# Patient Record
Sex: Male | Born: 1945 | Race: Black or African American | Hispanic: No | Marital: Single | Smoking: Current every day smoker
Health system: Southern US, Community
[De-identification: ages and names within clinical notes are randomized; demographics above are authoritative.]

## PROBLEM LIST (undated history)

## (undated) DIAGNOSIS — E785 Hyperlipidemia, unspecified: Secondary | ICD-10-CM

## (undated) DIAGNOSIS — N189 Chronic kidney disease, unspecified: Secondary | ICD-10-CM

## (undated) DIAGNOSIS — I219 Acute myocardial infarction, unspecified: Secondary | ICD-10-CM

## (undated) DIAGNOSIS — C801 Malignant (primary) neoplasm, unspecified: Secondary | ICD-10-CM

## (undated) DIAGNOSIS — Z716 Tobacco abuse counseling: Secondary | ICD-10-CM

## (undated) DIAGNOSIS — I509 Heart failure, unspecified: Secondary | ICD-10-CM

## (undated) DIAGNOSIS — K219 Gastro-esophageal reflux disease without esophagitis: Secondary | ICD-10-CM

## (undated) DIAGNOSIS — I679 Cerebrovascular disease, unspecified: Secondary | ICD-10-CM

## (undated) DIAGNOSIS — R0602 Shortness of breath: Secondary | ICD-10-CM

## (undated) DIAGNOSIS — I639 Cerebral infarction, unspecified: Secondary | ICD-10-CM

## (undated) DIAGNOSIS — E079 Disorder of thyroid, unspecified: Secondary | ICD-10-CM

## (undated) DIAGNOSIS — Z720 Tobacco use: Secondary | ICD-10-CM

## (undated) DIAGNOSIS — I10 Essential (primary) hypertension: Secondary | ICD-10-CM

## (undated) HISTORY — DX: Tobacco abuse counseling: Z71.6

## (undated) HISTORY — DX: Chronic kidney disease, unspecified: N18.9

## (undated) HISTORY — DX: Cerebral infarction, unspecified: I63.9

## (undated) HISTORY — DX: Tobacco use: Z72.0

## (undated) HISTORY — DX: Heart failure, unspecified: I50.9

## (undated) HISTORY — DX: Essential (primary) hypertension: I10

## (undated) HISTORY — DX: Disorder of thyroid, unspecified: E07.9

## (undated) HISTORY — DX: Cerebrovascular disease, unspecified: I67.9

## (undated) HISTORY — DX: Acute myocardial infarction, unspecified: I21.9

## (undated) HISTORY — DX: Malignant (primary) neoplasm, unspecified: C80.1

## (undated) HISTORY — DX: Hyperlipidemia, unspecified: E78.5

## (undated) HISTORY — PX: OTHER SURGICAL HISTORY: SHX169

## (undated) HISTORY — PX: ANKLE FRACTURE SURGERY: SHX122

---

## 2001-08-14 HISTORY — PX: BACK SURGERY: SHX140

## 2002-01-22 ENCOUNTER — Encounter: Payer: Self-pay | Admitting: Neurosurgery

## 2002-01-27 ENCOUNTER — Ambulatory Visit (HOSPITAL_COMMUNITY): Admission: RE | Admit: 2002-01-27 | Discharge: 2002-01-27 | Payer: Self-pay | Admitting: Neurosurgery

## 2002-01-27 ENCOUNTER — Encounter: Payer: Self-pay | Admitting: Neurosurgery

## 2003-01-27 ENCOUNTER — Encounter: Payer: Self-pay | Admitting: Neurosurgery

## 2003-01-27 ENCOUNTER — Encounter: Admission: RE | Admit: 2003-01-27 | Discharge: 2003-01-27 | Payer: Self-pay | Admitting: Neurosurgery

## 2003-04-08 ENCOUNTER — Encounter: Payer: Self-pay | Admitting: Neurosurgery

## 2003-04-13 ENCOUNTER — Encounter: Payer: Self-pay | Admitting: Neurosurgery

## 2003-04-13 ENCOUNTER — Ambulatory Visit (HOSPITAL_COMMUNITY): Admission: RE | Admit: 2003-04-13 | Discharge: 2003-04-14 | Payer: Self-pay | Admitting: Neurosurgery

## 2003-06-15 ENCOUNTER — Other Ambulatory Visit: Payer: Self-pay

## 2004-07-04 ENCOUNTER — Encounter: Admission: RE | Admit: 2004-07-04 | Discharge: 2004-07-04 | Payer: Self-pay | Admitting: Neurosurgery

## 2006-03-13 ENCOUNTER — Other Ambulatory Visit: Payer: Self-pay

## 2006-03-14 ENCOUNTER — Other Ambulatory Visit: Payer: Self-pay

## 2006-09-19 ENCOUNTER — Other Ambulatory Visit: Payer: Self-pay

## 2007-02-27 ENCOUNTER — Other Ambulatory Visit: Payer: Self-pay

## 2009-05-19 ENCOUNTER — Other Ambulatory Visit: Payer: Self-pay | Admitting: Emergency Medicine

## 2009-05-26 ENCOUNTER — Other Ambulatory Visit: Payer: Self-pay

## 2009-08-14 ENCOUNTER — Emergency Department (HOSPITAL_COMMUNITY): Admission: EM | Admit: 2009-08-14 | Discharge: 2009-08-14 | Payer: Self-pay | Admitting: Emergency Medicine

## 2009-09-15 ENCOUNTER — Other Ambulatory Visit: Payer: Self-pay

## 2009-11-11 ENCOUNTER — Other Ambulatory Visit: Payer: Self-pay

## 2010-12-30 NOTE — Op Note (Signed)
NAME:  Martin Haney, Martin Haney NO.:  [id number]   MEDICAL RECORD NO.:  [id number]                   PATIENT TYPE:  OIB   LOCATION:  2894                                 FACILITY:  [facility]   PHYSICIAN:  Kathaleen Maser. Haney, M.D.                 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   PROCEDURE:  Left L3-4 decompressive laminotomy with foraminotomy, left L4-5  reexploration of laminotomy with redo microdiskectomy.   SURGEON:  Kathaleen Maser. Haney, M.D.   ASSISTANT:  Reinaldo Meeker, M.D.   ANESTHESIA:  General endotracheal anesthesia.   INDICATIONS FOR PROCEDURE:  Martin Haney is a 65 year old male who is status  post  a previous left-sided L4-5 microdiskectomy. Postoperatively the  patient has had continued left lower extremity radicular pain, which  although improved, is still  quite limiting. A workup has demonstrated  worsening stenosis at the L3-4 level  and some degree of a small  recurrent  disk  herniation at L4-5. The patient has failed conservative management. He  presents now for decompression of L3-4 and redo microdiskectomy at L4-5.   DESCRIPTION OF PROCEDURE:  The patient was placed on the operating table in  the supine position. After an adequate level  of anesthesia was achieved,  the patient was placed prone  onto the Wilson frame and appropriately  padded. The patient's lumbar region was prepped and draped sterilely.   The procedure was started with a skin incision overlying the L3, 4 and 5  levels. This was carried down sharply in the midline. A subperiosteal  dissection was performed exposing the lamina and facet joints of  L3-4 and L4-  5. A deep self-retaining retractor was placed. Interoperative x-rays were  taken and the levels were confirmed.   A laminotomy was then performed at L3-4 using a high-speed drill and  Kerrison rongeurs to remove the inferior one-third of the lamina of L3, the  medial one-third of the L4 facet joint and the entire left L4 lamina down to  the level of the previous laminotomy site  at L4-5. The L4-5 laminotomy was  then dissected free using Kerrison rongeurs and dental instruments. It was  widened slightly. Epidural scar was resected.   The microscope was brought into the field, using microdissection at both the  L3-4 and L4-5 levels. The L4 nerve root was identified and followed into its  foramen. It was freed from any compression. The disk space was identified  and explored. It was found to be free of any compression.   Attention was then placed at the L4-5. The L5  nerve root was identified. It  was mobilized along  the pedicle. Epidural scar was resected. The  thecal  sac and L5 nerve root were mobilized towards the midline. The disk space  itself was broadly herniated. It was incised with a #15 blade, retracting  the fascia, a widened the disk space was then achieved using the pituitary  rongeurs and Epstein curets. All elements of the disk herniation were  completely resected.  All degenerative disk material was removed from the  interspace.   The wound was then irrigated with antibiotic solution. Gelfoam was placed  for operative hemostasis which was found to be good. The microscope and  retractors were removed. Hemostasis in the muscle was achieved with  electrocautery. The wounds were closed in layers with Vicryl sutures. Steri-  Strips and sterile dressing were applied.   There were no apparent complications. The patient tolerated the procedure  well and he returns to the recovery room in stable condition.                                               Martin A.  Haney, M.D.    HAP/MEDQ  D:  04/13/2003  T:  04/13/2003  Job:  [id number]

## 2010-12-30 NOTE — Op Note (Signed)
[place]  Patient:    Martin Haney, Martin Haney Visit Number: [id number] MRN: [id number]          Service Type: DSU Location: 3000 [id number] Attending Physician:  Donn Pierini Dictated by:   Julio Sicks, M.D. Proc. Date: 01/27/02 Admit Date:  01/27/2002 Discharge Date: 01/27/2002                             Operative Report  PREOPERATIVE DIAGNOSIS:  Left L4-5 herniated nucleus pulposus with radiculopathy.  POSTOPERATIVE DIAGNOSIS:  Left L4-5 herniated nucleus pulposus with radiculopathy.  OPERATION PERFORMED:  Left L4-5 laminotomy and microdiskectomy.  SURGEON:  Julio Sicks, M.D.  ASSISTANT:  Reinaldo Meeker, M.D.  ANESTHESIA:  General endotracheal.  INDICATIONS FOR PROCEDURE:  The patient is a 65 year old male with history of left lower extremity pain, paresthesias and weakness including left-sided L5 radiculopathy, failing conservative management.  MRI scan demonstrates a large left-sided L4-5 disk herniation with compression of the left-sided L5 nerve root.  The patient is aware of the risks and benefits and wishes to proceed with a left-sided L4-5 laminotomy and microdiskectomy for hopeful improvement of symptoms.  DESCRIPTION OF PROCEDURE:  The patient was taken to the operating room and placed on the operating table in supine position.  After an adequate level of anesthesia was achieved, the patient was positioned prone onto a Wilson frame and appropriately padded.  The patients lumbar region was shaved and prepped sterilely.  A 10 blade was used to make a linear skin incision overlying the L4-5 interspace.  This was carried down sharply in the midline.  Subperiosteal dissection was performed on the left side exposing the lamina and facet joints at L4 and L5.  The deep self-retaining retractor was placed.  Intraoperative X-ray was taken and the level was confirmed.  A laminotomy was then performed using a high speed drill and  Kerrison rongeurs to remove the inferior one fourth of the lamina of L4, the medial one fourth of the L4-5 facet joint and superior rim of the L5 lamina.  The ligamentum flavum was then elevated and resected in piecemeal fashion using Kerrison rongeurs.  The underlying thecal sac and exiting L5 nerve roots were identified.  Microscope was then brought into the field and used for microdissection of the left sided L5 nerve root and underlying disk herniation.  Epidural venous plexus was coagulated and cut. The thecal sac and L5 nerve root were mobilized and retracted towards the midline.  The disk herniation was readily apparent.  This was incised with a 15 blade in rectangular fashion.  A wide disk space clean-out was then achieved using pituitary rongeurs, upward angle pituitary rongeurs and Epstein curets.  All elements of disk herniation were completely resected.  All loose or obviously degenerative disk material was removed from the interspace. After a very thorough diskectomy had been performed, the thecal sac and nerve roots were explored and found to be free from any injury or further compression.  The wound was then irrigated with antibiotic solution.  Gelfoam was placed topically for hemostasis which was found to be good.  The microscope and retractor system were removed.  Hemostasis in the muscle was achieved with electrocautery.  The wound was then closed in layers with Vicryl suture.  Steri-Strips and sterile dressing were applied.  There were no apparent complications.  The patient tolerated the procedure well and he returned to the recovery room postoperatively. Gelfoam  was placed over the laminotomy defect.  Microscope and retractor system were removed.  Hemostasis in the muscle achieved with electrocautery. The wound was then closed in layers with Vicryl sutures.  Steri-Strips and sterile dressing were applied.  There were no apparent complications.  The patient tolerated the  procedure well and she returned to the recovery room postoperatively. Dictated by:   Julio Sicks, M.D. Attending Physician:  Donn Pierini DD:  01/27/02 TD:  01/27/02 Job: [id number]

## 2011-01-05 ENCOUNTER — Other Ambulatory Visit: Payer: Self-pay

## 2011-01-30 IMAGING — CT CT ABD-PELV W/O CM
1 of 3 series · 12 of 32 positions shown, 18 images · non-contrast
Comparison: none

REASON FOR EXAM: (1) left flank pain; (2) same
COMMENTS:

[Series 2: stone · axial · 0.59mm/px · z∈[-460,-73]mm · 12 of 151 slices shown, 18 images]
[im 11/151  soft-tissue]
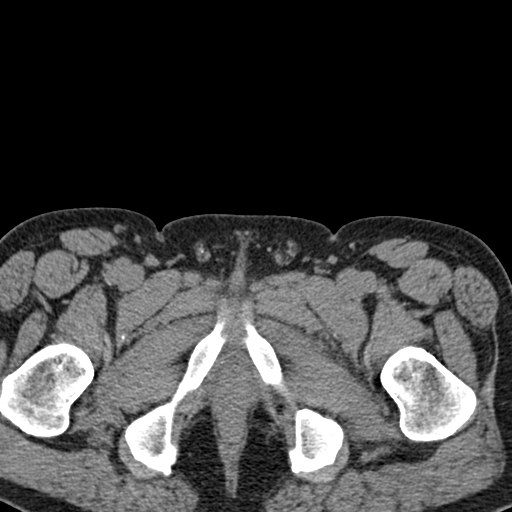
[im 11/151  bone]
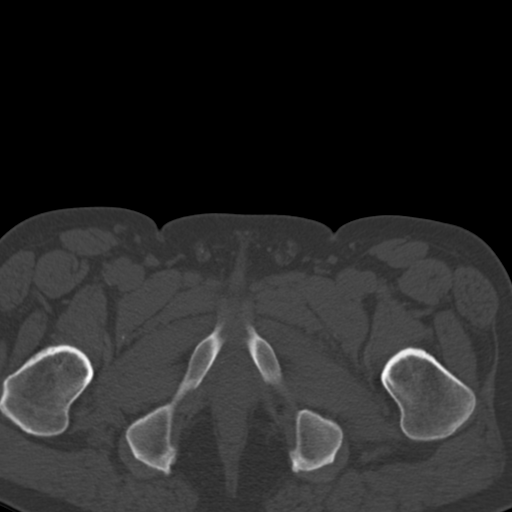
[im 22/151  soft-tissue]
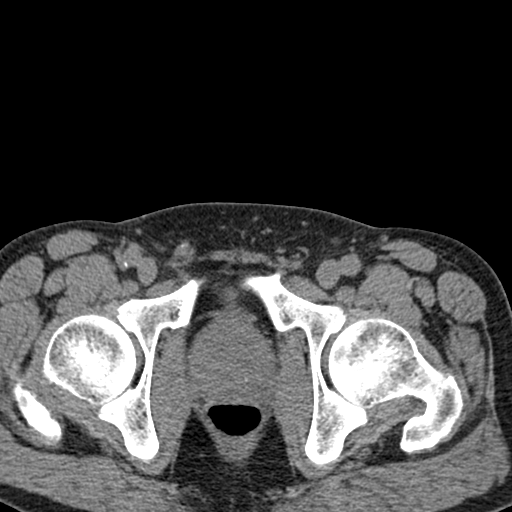
[im 33/151  soft-tissue]
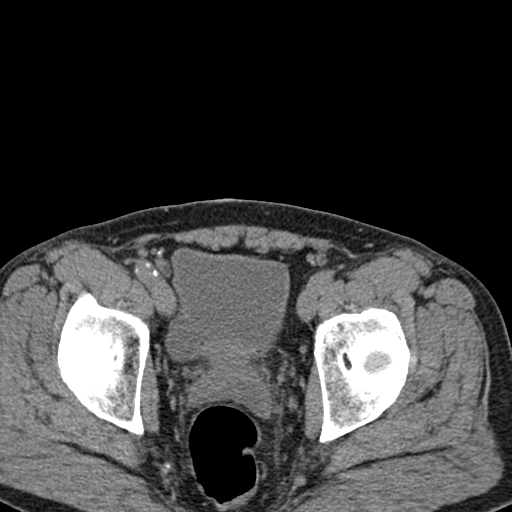
[im 43/151  soft-tissue]
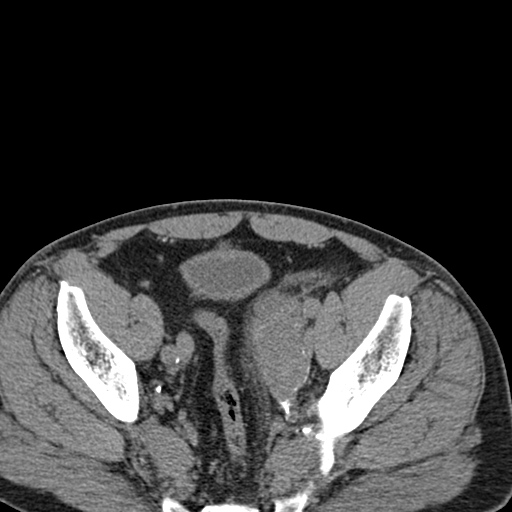
[im 54/151  soft-tissue]
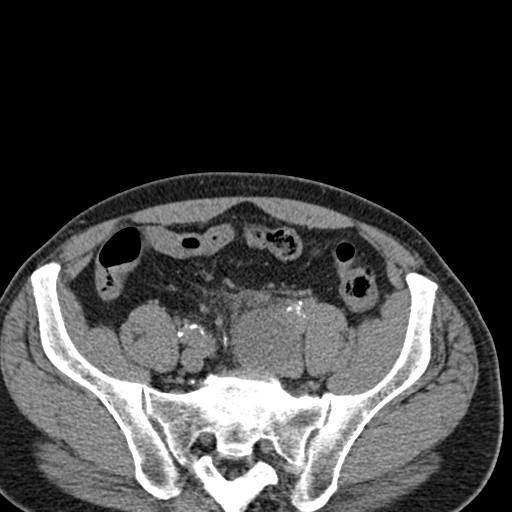
[im 65/151  soft-tissue]
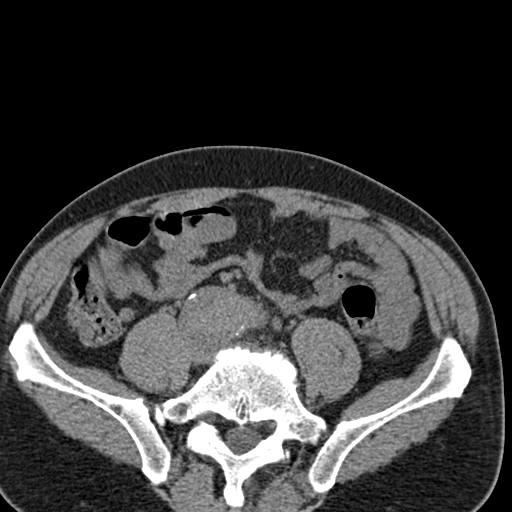
[im 86/151  soft-tissue]
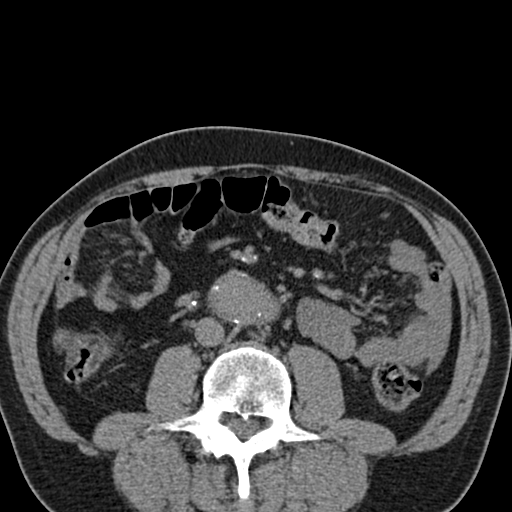
[im 97/151  soft-tissue]
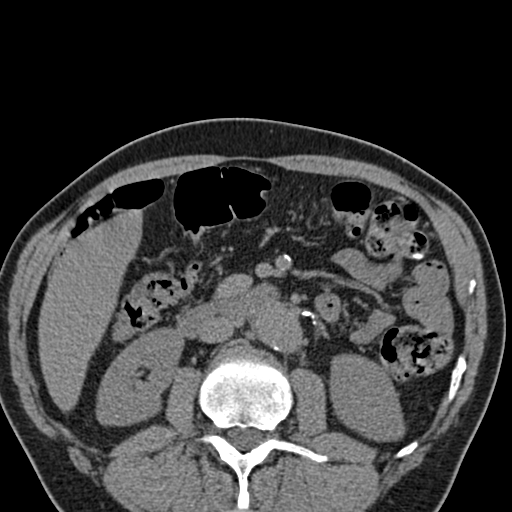
[im 108/151  soft-tissue]
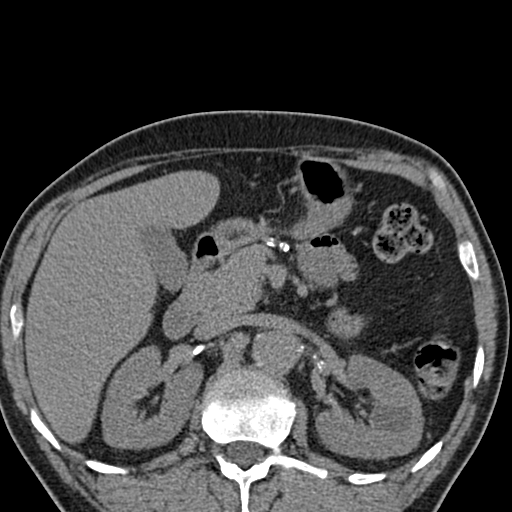
[im 108/151  lung]
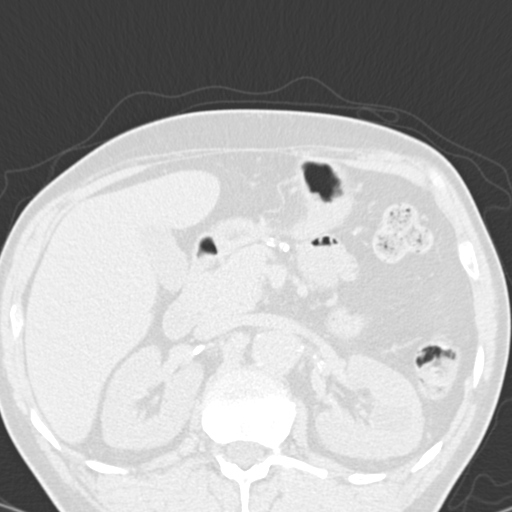
[im 108/151  bone]
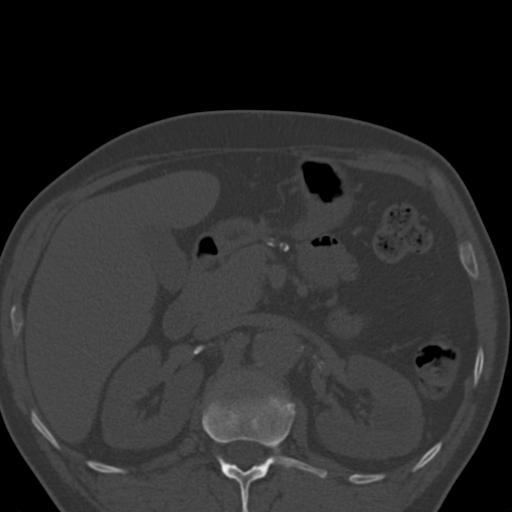
[im 118/151  soft-tissue]
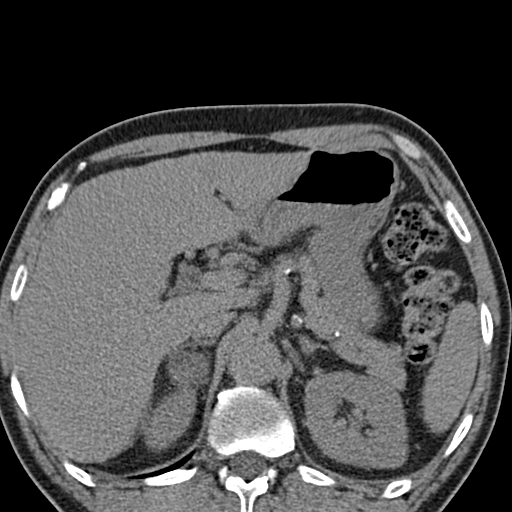
[im 118/151  lung]
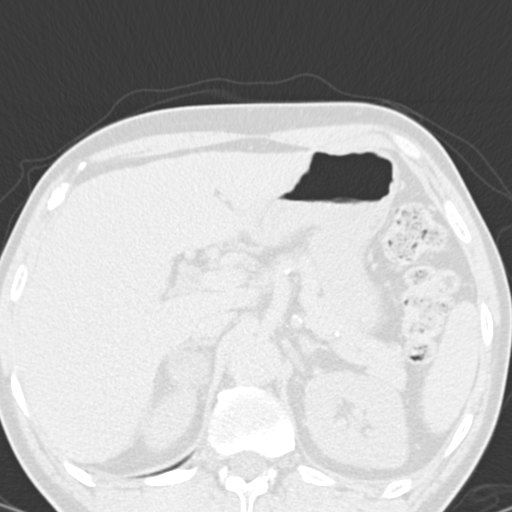
[im 129/151  soft-tissue]
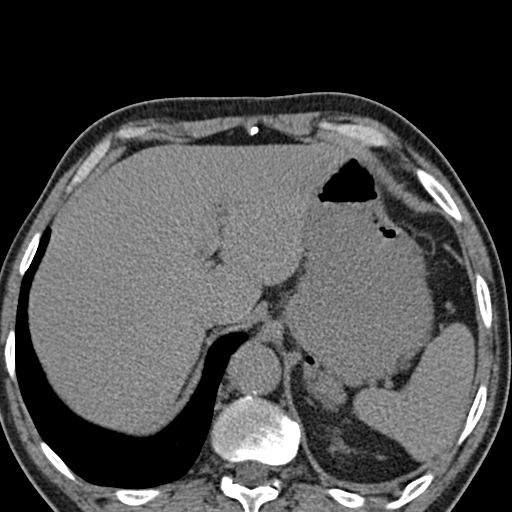
[im 129/151  lung]
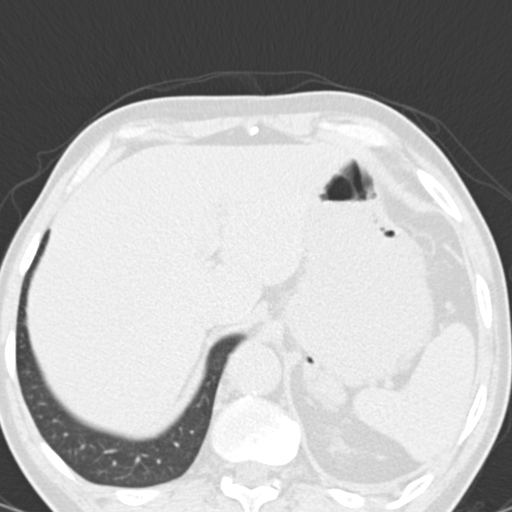
[im 140/151  soft-tissue]
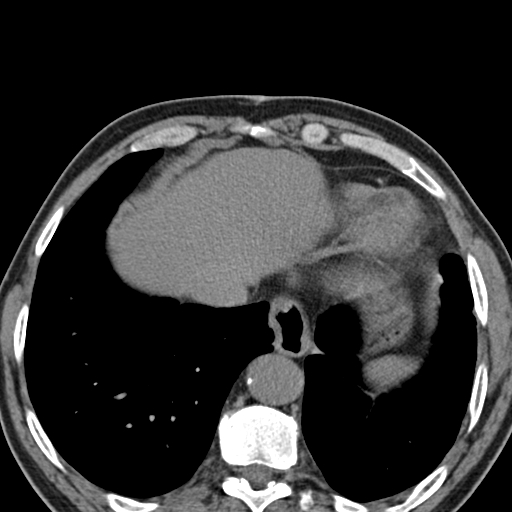
[im 140/151  lung]
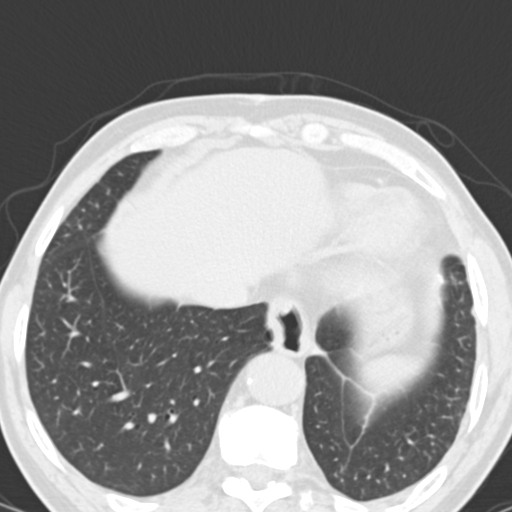

[12 of 32 positions shown; findings below may reference images not displayed]

PROCEDURE:     CT  - CT ABDOMEN AND PELVIS W[DATE]  [DATE]

RESULT:     Helical noncontrasted 3 mm sections were obtained from the lung
bases through the pubic symphysis. This study was compared to a previous CT
of the chest and abdomen dated 01/14/2008.

Evaluation of the lung bases demonstrates no gross abnormalities.

Noncontrast evaluation of the liver, spleen and RIGHT kidney is
unremarkable. The LEFT kidney demonstrates a low attenuating mass in the
cortical portion of the kidney like representing a cyst unchanged when
compared to the previous study. A RIGHT adrenal mass is appreciated
measuring 2.82 x 3.61 cm in AP x transverse dimensions with technical
variability taken into consideration stable when compared to the previous
study. A LEFT adrenal mass is also appreciated which appears stable when
compared to the previous study. Evaluation of the upper abdomen demonstrates
no evidence of free fluid or drainable loculated fluid collections or
adenopathy. There is aneurysmal dilatation of the infrarenal abdominal aorta
measuring 3.44 x 3.63 cm in AP by transverse dimensions. There is aneurysmal
dilatation of the RIGHT and LEFT iliacs with the RIGHT measuring 2.72 cm in
AP dimensions and the LEFT 3.34 in maximal transverse dimensions. This is
just below the level of the bifurcation. Within the region of the proximal
LEFT common iliac at the level of the bifurcation, a soft tissue mass is
appreciated measuring 5.46 x 5.66 cm. There are areas of peripheral
calcifications within this mass and this area either compresses, invades or
involves the LEFT common iliac. This mass is suspicious for possible
pseudoaneurysm of the iliac. Further evaluation with contrasted CT is
recommended and possibly vascular interventional surgical consultation.
Areas of stranding are appreciated in the surrounding mesenteric fat which
may represent inflammatory change. Areas of mild hemorrhage or previous
hemorrhage cannot be excluded. No drainable loculated fluid collections are
appreciated. The prostate is enlarged measuring 6.36 cm in diameter.
IMPRESSION: 1. Findings concerning for pseudoaneurysm involving the LEFT common iliac.
Alternatively, a nonaneurysmal mass with compression and/or invasion is a
diagnostic consideration. Further evaluation with contrast CT is recommended
and possibly surgical endovascular consultation.
2. Infrarenal abdominal aortic aneurysm as described above as well as
aneurysmal dilatation of the iliacs.
3. Stable bilateral adrenal masses.
4. Dr. Luisa of the Emergency Department was informed of these findings at
the time of the initial interpretation.

## 2011-04-15 DIAGNOSIS — I679 Cerebrovascular disease, unspecified: Secondary | ICD-10-CM

## 2011-04-15 HISTORY — DX: Cerebrovascular disease, unspecified: I67.9

## 2011-05-15 ENCOUNTER — Other Ambulatory Visit: Payer: Self-pay | Admitting: Emergency Medicine

## 2011-05-16 DIAGNOSIS — I679 Cerebrovascular disease, unspecified: Secondary | ICD-10-CM

## 2011-05-16 DIAGNOSIS — I2510 Atherosclerotic heart disease of native coronary artery without angina pectoris: Secondary | ICD-10-CM

## 2011-05-16 DIAGNOSIS — I10 Essential (primary) hypertension: Secondary | ICD-10-CM

## 2011-05-16 DIAGNOSIS — G459 Transient cerebral ischemic attack, unspecified: Secondary | ICD-10-CM

## 2011-05-23 ENCOUNTER — Encounter: Payer: Self-pay | Admitting: Internal Medicine

## 2011-05-23 DIAGNOSIS — I10 Essential (primary) hypertension: Secondary | ICD-10-CM

## 2011-05-23 DIAGNOSIS — G459 Transient cerebral ischemic attack, unspecified: Secondary | ICD-10-CM | POA: Insufficient documentation

## 2011-05-23 DIAGNOSIS — I2510 Atherosclerotic heart disease of native coronary artery without angina pectoris: Secondary | ICD-10-CM | POA: Insufficient documentation

## 2011-05-23 DIAGNOSIS — I679 Cerebrovascular disease, unspecified: Secondary | ICD-10-CM | POA: Insufficient documentation

## 2011-06-07 ENCOUNTER — Ambulatory Visit (INDEPENDENT_AMBULATORY_CARE_PROVIDER_SITE_OTHER): Payer: Managed Care, Other (non HMO) | Admitting: Internal Medicine

## 2011-06-07 ENCOUNTER — Encounter: Payer: Self-pay | Admitting: Internal Medicine

## 2011-06-07 DIAGNOSIS — I679 Cerebrovascular disease, unspecified: Secondary | ICD-10-CM

## 2011-06-07 DIAGNOSIS — G459 Transient cerebral ischemic attack, unspecified: Secondary | ICD-10-CM

## 2011-06-07 DIAGNOSIS — Z79899 Other long term (current) drug therapy: Secondary | ICD-10-CM

## 2011-06-07 DIAGNOSIS — M129 Arthropathy, unspecified: Secondary | ICD-10-CM

## 2011-06-07 DIAGNOSIS — I10 Essential (primary) hypertension: Secondary | ICD-10-CM

## 2011-06-07 DIAGNOSIS — I509 Heart failure, unspecified: Secondary | ICD-10-CM

## 2011-06-07 DIAGNOSIS — E785 Hyperlipidemia, unspecified: Secondary | ICD-10-CM

## 2011-06-07 DIAGNOSIS — Z7189 Other specified counseling: Secondary | ICD-10-CM

## 2011-06-07 DIAGNOSIS — M1990 Unspecified osteoarthritis, unspecified site: Secondary | ICD-10-CM

## 2011-06-07 DIAGNOSIS — Z716 Tobacco abuse counseling: Secondary | ICD-10-CM

## 2011-06-07 DIAGNOSIS — I2510 Atherosclerotic heart disease of native coronary artery without angina pectoris: Secondary | ICD-10-CM

## 2011-06-07 MED ORDER — SPIRONOLACTONE 25 MG PO TABS: 25.0000 mg | ORAL_TABLET | Freq: Every day | ORAL | Status: DC

## 2011-06-07 MED ORDER — AMLODIPINE BESYLATE 10 MG PO TABS: 10.0000 mg | ORAL_TABLET | Freq: Every day | ORAL | Status: DC

## 2011-06-07 MED ORDER — TRAMADOL HCL 50 MG PO TABS: 50.0000 mg | ORAL_TABLET | Freq: Four times a day (QID) | ORAL | Status: AC | PRN

## 2011-06-07 NOTE — Patient Instructions (Addendum)
You need to start  taking amlodipine 10 mg daily, and spironolactone 25 mg daily for your blood pressure.    Continue carvedilol, losartan and hydralazine also for blood pressure.   Stop all products that have alleve (naproxen), iIbuprofen.  bc they are raising your bp.   Use tramadol or tylenol for pain .  I have sent a prescription for tramadol to [facility].   Come in one week for bp check and a blood test.

## 2011-06-07 NOTE — Progress Notes (Signed)
Subjective:    Patient ID: Martin Haney, male    DOB: 1946-07-20, 65 y.o.   MRN: [id number]  HPI  Mr. Gaiser is a 65 yo AA male with a complex medical history of tobacco related disorders including lung CA, s/p LUL resection, CAD with prior CABG, uncontrolled HTN with prior AAA repair, presents for  hospital followup after having  Sudden onset of right sided weakness with diffuse cerebrovascular atherosclerosis noted on CT angiogram.  During that hospitalization he finally appeared to understand that continued tobacco abuse was going to prove fatal and left the hospital with a genuine desire to quit smoking and control his blood pressure.  He has been weaing a nicotine patch daily but continues to have nicotine cravings.,  He reports compliance with discharge medications but did not bring his instructions, cannot name his medications, and does not appear to be taking al those that have been prescribed.  His girlfriend Eunice Blase is accompanying him and reports compliance as well.  He denies chest pain, shortness of breath, dizziness, headaches and right sided weakness, Past Medical History  Diagnosis Date  . Hypertension   . Hyperlipidemia   . Thyroid disease   . Tobacco abuse   . Tobacco abuse counseling   . Myocardial infarction   . CHF (congestive heart failure)     ischemic cardiomyopathy, systolic and diastolic dysfunction  by  ECHO Mar 2011  . Lung Cancer     s/p LUL lobecotmy, remote  . 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 Cerebrovascular disease Sept 2012    diffuse, by CT angiogram    No current outpatient prescriptions on file prior to visit.   Review of Systems  Constitutional: Negative for fever, chills, diaphoresis, activity change, appetite change, fatigue and unexpected weight change.  HENT: Negative for hearing loss, ear pain, nosebleeds, congestion, sore throat, facial swelling, rhinorrhea, sneezing, drooling, mouth sores, trouble  swallowing, neck pain, neck stiffness, dental problem, voice change, postnasal drip, sinus pressure, tinnitus and ear discharge.   Eyes: Negative for photophobia, pain, discharge, redness, itching and visual disturbance.  Respiratory: Negative for apnea, cough, choking, chest tightness, shortness of breath, wheezing and stridor.   Cardiovascular: Negative for chest pain, palpitations and leg swelling.  Gastrointestinal: Negative for nausea, vomiting, abdominal pain, diarrhea, constipation, blood in stool, abdominal distention, anal bleeding and rectal pain.  Genitourinary: Negative for dysuria, urgency, frequency, hematuria, flank pain, decreased urine volume, scrotal swelling, difficulty urinating and testicular pain.  Musculoskeletal: Negative for myalgias, back pain, joint swelling, arthralgias and gait problem.  Skin: Negative for color change, rash and wound.  Neurological: Negative for dizziness, tremors, seizures, syncope, speech difficulty, weakness, light-headedness, numbness and headaches.  Psychiatric/Behavioral: Negative for suicidal ideas, hallucinations, behavioral problems, confusion, sleep disturbance, dysphoric mood, decreased concentration and agitation. The patient is not nervous/anxious.        Objective:   Physical Exam  Constitutional: He is oriented to person, place, and time.  HENT:  Head: Normocephalic and atraumatic.  Mouth/Throat: Oropharynx is clear and moist.  Eyes: Conjunctivae and EOM are normal.  Neck: Normal range of motion. Neck supple. No JVD present. No thyromegaly present.  Cardiovascular: Normal rate, regular rhythm and normal heart sounds.   Pulmonary/Chest: Effort normal and breath sounds normal. He has no wheezes. He has no rales.  Abdominal: Soft. Bowel sounds are normal. He exhibits no mass. There is no tenderness. There is no rebound.  Musculoskeletal: Normal range of motion. He exhibits  no edema.  Neurological: He is alert and oriented to person,  place, and time.  Skin: Skin is warm and dry.  Psychiatric: He has a normal mood and affect.          Assessment & Plan:

## 2011-06-11 ENCOUNTER — Encounter: Payer: Self-pay | Admitting: Internal Medicine

## 2011-06-11 DIAGNOSIS — C801 Malignant (primary) neoplasm, unspecified: Secondary | ICD-10-CM | POA: Insufficient documentation

## 2011-06-11 DIAGNOSIS — Z720 Tobacco use: Secondary | ICD-10-CM | POA: Insufficient documentation

## 2011-06-11 DIAGNOSIS — I509 Heart failure, unspecified: Secondary | ICD-10-CM | POA: Insufficient documentation

## 2011-06-11 DIAGNOSIS — Z716 Tobacco abuse counseling: Secondary | ICD-10-CM | POA: Insufficient documentation

## 2011-06-11 DIAGNOSIS — I219 Acute myocardial infarction, unspecified: Secondary | ICD-10-CM | POA: Insufficient documentation

## 2011-06-11 DIAGNOSIS — N189 Chronic kidney disease, unspecified: Secondary | ICD-10-CM | POA: Insufficient documentation

## 2011-06-11 NOTE — Assessment & Plan Note (Signed)
Uncontrolled today, with multiple medications missing from his current list of refills per review of pharmacy printout.  New rxs for amlodipine and spironolactone given to patient today and list of current medications and their indication given.

## 2011-06-11 NOTE — Assessment & Plan Note (Signed)
He reports abstinence but smells of smoke, which he attributes to his grilfiend's continued tobacc abuse in his presence.  I have recommended that he increased his Nicoderm  patch to 21 mcg to offset his nicotine cravings and have again reiterated the effect of smoking on bood pressure and artery patency.

## 2011-06-11 NOTE — Assessment & Plan Note (Signed)
He has systolic dysfunction but is not a candidate for aggressive intervention due to diffuse disease.  Continue medical management.

## 2011-06-11 NOTE — Assessment & Plan Note (Signed)
He as not started on plavix or aggrenox during hospitalization due to his risk of bleeding given his uncontrolled htn.  Continue statin, ASA.

## 2011-06-11 NOTE — Assessment & Plan Note (Signed)
With recent admission to [facility] . MRI did not show a CVA but CT angiogram and vascular consult reveaed diffuse nonoperable cerbrovasccular stenosis with several arteries 100% occluded.

## 2011-06-11 NOTE — Assessment & Plan Note (Signed)
Ischemic with systolic dysfunction ,complicated by diastolic dysfunction due to uncontroled HTN>  He is currently asmptomatic and is on the appropriate medications

## 2011-06-19 ENCOUNTER — Encounter: Payer: Self-pay | Admitting: Internal Medicine

## 2011-06-19 ENCOUNTER — Ambulatory Visit (INDEPENDENT_AMBULATORY_CARE_PROVIDER_SITE_OTHER): Payer: Medicare Other | Admitting: Internal Medicine

## 2011-06-19 VITALS — BP 148/92 | HR 59 | Resp 14 | Ht 73.0 in | Wt 175.0 lb

## 2011-06-19 DIAGNOSIS — E785 Hyperlipidemia, unspecified: Secondary | ICD-10-CM

## 2011-06-19 DIAGNOSIS — Z720 Tobacco use: Secondary | ICD-10-CM

## 2011-06-19 DIAGNOSIS — F17200 Nicotine dependence, unspecified, uncomplicated: Secondary | ICD-10-CM

## 2011-06-19 DIAGNOSIS — I10 Essential (primary) hypertension: Secondary | ICD-10-CM

## 2011-06-19 DIAGNOSIS — J449 Chronic obstructive pulmonary disease, unspecified: Secondary | ICD-10-CM

## 2011-06-19 DIAGNOSIS — Z79899 Other long term (current) drug therapy: Secondary | ICD-10-CM

## 2011-06-19 MED ORDER — BUDESONIDE-FORMOTEROL FUMARATE 160-4.5 MCG/ACT IN AERO: 2.0000 | INHALATION_SPRAY | Freq: Two times a day (BID) | RESPIRATORY_TRACT | Status: DC

## 2011-06-19 NOTE — Patient Instructions (Signed)
Your blood pressure is much better today.  Continue all 5 medicines for blood pressure.  Try another brand of the nicotine patches, or one of the other proudcts (Nicorette gum,  Electric cigarette)  Please return for your fasting cholesterol and other blood tests as soon as possiblde  Cut the crestor 20 mg sample tablets in half.  I have a coupon for the Symbicort, but no samples

## 2011-06-19 NOTE — Progress Notes (Signed)
  Subjective:    Patient ID: Martin Haney, male    DOB: 1945-12-23, 65 y.o.   MRN: [id number]  HPI  65 yo returns for a one week recheck on his blood pressure.  At his hospital followup appt last week after admission for stroke, his bp was uncontrolled and since he could not name his medications , investigation revelaed a dsiscrepancy between his dc medications and what his pharmacy currently had on record.   Pressures have improved since resuming the spironolactone and losartan.  His home bps are in the 130/88, which is an acceptable goal given the fact that his recent cerebral angiogram showed such diffuse disease that inducing a low flow state would be detrimental.  His second issue is his tobacco abuse, which he finally addressed after his stroke. Has stopped using the nicoderm transdermal patches they were causing a rash     Review of Systems  Constitutional: Negative for fever, chills, diaphoresis, activity change, appetite change, fatigue and unexpected weight change.  HENT: Negative for hearing loss, ear pain, nosebleeds, congestion, sore throat, facial swelling, rhinorrhea, sneezing, drooling, mouth sores, trouble swallowing, neck pain, neck stiffness, dental problem, voice change, postnasal drip, sinus pressure, tinnitus and ear discharge.   Eyes: Negative for photophobia, pain, discharge, redness, itching and visual disturbance.  Respiratory: Negative for apnea, cough, choking, chest tightness, shortness of breath, wheezing and stridor.   Cardiovascular: Negative for chest pain, palpitations and leg swelling.  Gastrointestinal: Negative for nausea, vomiting, abdominal pain, diarrhea, constipation, blood in stool, abdominal distention, anal bleeding and rectal pain.  Genitourinary: Negative for dysuria, urgency, frequency, hematuria, flank pain, decreased urine volume, scrotal swelling, difficulty urinating and testicular pain.  Musculoskeletal: Negative for myalgias, back pain, joint  swelling, arthralgias and gait problem.  Skin: Negative for color change, rash and wound.  Neurological: Negative for dizziness, tremors, seizures, syncope, speech difficulty, weakness, light-headedness, numbness and headaches.  Psychiatric/Behavioral: Negative for suicidal ideas, hallucinations, behavioral problems, confusion, sleep disturbance, dysphoric mood, decreased concentration and agitation. The patient is not nervous/anxious.    BP 148/92  Pulse 59  Resp 14  Ht 6\' 1"  (1.854 m)  Wt 175 lb (79.379 kg)  BMI 23.09 kg/m2  SpO2 98%     Objective:   Physical Exam  Constitutional: He is oriented to person, place, and time.  HENT:  Head: Normocephalic and atraumatic.  Mouth/Throat: Oropharynx is clear and moist.  Eyes: Conjunctivae and EOM are normal.  Neck: Normal range of motion. Neck supple. No JVD present. No thyromegaly present.  Cardiovascular: Normal rate, regular rhythm and normal heart sounds.   Pulmonary/Chest: Effort normal and breath sounds normal. He has no wheezes. He has no rales.  Abdominal: Soft. Bowel sounds are normal. He exhibits no mass. There is no tenderness. There is no rebound.  Musculoskeletal: Normal range of motion. He exhibits no edema.  Neurological: He is alert and oriented to person, place, and time.  Skin: Skin is warm and dry.  Psychiatric: He has a normal mood and affect.          Assessment & Plan:  Hypertension:  Improved on current regimen. BMET pending.    Tobacco abuse: he has developed an allergic reaction to "my"  nicoderm patches. I reminded him that they are not "my " patches and that his tobacco abuse is his health concern and his responsibility.  Recommended he try an alternate brand or alternative nicotine delivery systems.

## 2011-06-20 LAB — COMPREHENSIVE METABOLIC PANEL
ALT: 12 U/L (ref 0–53)
AST: 19 U/L (ref 0–37)
Albumin: 4.5 g/dL (ref 3.5–5.2)
BUN: 22 mg/dL (ref 6–23)
CO2: 24 mEq/L (ref 19–32)
Calcium: 11.2 mg/dL — ABNORMAL HIGH (ref 8.4–10.5)
Chloride: 108 mEq/L (ref 96–112)
GFR: 47.72 mL/min — ABNORMAL LOW (ref >60.00)
Potassium: 5.7 mEq/L — ABNORMAL HIGH (ref 3.5–5.1)

## 2011-06-23 ENCOUNTER — Encounter: Payer: Self-pay | Admitting: Internal Medicine

## 2011-06-23 ENCOUNTER — Other Ambulatory Visit: Payer: Medicare Other

## 2011-06-26 ENCOUNTER — Other Ambulatory Visit (INDEPENDENT_AMBULATORY_CARE_PROVIDER_SITE_OTHER): Payer: Medicare Other | Admitting: *Deleted

## 2011-06-26 DIAGNOSIS — E876 Hypokalemia: Secondary | ICD-10-CM

## 2011-06-27 LAB — BASIC METABOLIC PANEL
BUN: 16 mg/dL (ref 6–23)
Calcium: 10.2 mg/dL (ref 8.4–10.5)
GFR: 59.41 mL/min — ABNORMAL LOW (ref >60.00)
Glucose, Bld: 94 mg/dL (ref 70–99)

## 2011-06-28 NOTE — Progress Notes (Signed)
  Subjective:    Patient ID: Martin Haney, male    DOB: 1945-12-18, 65 y.o.   MRN: [id number]  HPI    Review of Systems     Objective:   Physical Exam        Assessment & Plan:

## 2011-07-07 ENCOUNTER — Other Ambulatory Visit: Payer: Managed Care, Other (non HMO)

## 2011-08-02 ENCOUNTER — Other Ambulatory Visit: Payer: Self-pay | Admitting: Emergency Medicine

## 2011-08-03 DIAGNOSIS — F17200 Nicotine dependence, unspecified, uncomplicated: Secondary | ICD-10-CM

## 2011-08-03 DIAGNOSIS — I119 Hypertensive heart disease without heart failure: Secondary | ICD-10-CM

## 2011-08-03 DIAGNOSIS — I2510 Atherosclerotic heart disease of native coronary artery without angina pectoris: Secondary | ICD-10-CM

## 2011-08-03 DIAGNOSIS — I6350 Cerebral infarction due to unspecified occlusion or stenosis of unspecified cerebral artery: Secondary | ICD-10-CM

## 2011-08-05 ENCOUNTER — Encounter: Payer: Self-pay | Admitting: Internal Medicine

## 2011-08-05 DIAGNOSIS — I639 Cerebral infarction, unspecified: Secondary | ICD-10-CM | POA: Insufficient documentation

## 2011-08-10 ENCOUNTER — Encounter: Payer: Self-pay | Admitting: Internal Medicine

## 2011-08-10 ENCOUNTER — Ambulatory Visit: Payer: Medicare Other

## 2011-08-14 ENCOUNTER — Ambulatory Visit: Payer: Medicare Other

## 2011-08-18 ENCOUNTER — Encounter: Payer: Self-pay | Admitting: Internal Medicine

## 2011-08-18 ENCOUNTER — Ambulatory Visit (INDEPENDENT_AMBULATORY_CARE_PROVIDER_SITE_OTHER): Payer: Managed Care, Other (non HMO) | Admitting: Internal Medicine

## 2011-08-18 DIAGNOSIS — I679 Cerebrovascular disease, unspecified: Secondary | ICD-10-CM

## 2011-08-18 DIAGNOSIS — Z23 Encounter for immunization: Secondary | ICD-10-CM

## 2011-08-18 DIAGNOSIS — Z720 Tobacco use: Secondary | ICD-10-CM

## 2011-08-18 DIAGNOSIS — F17200 Nicotine dependence, unspecified, uncomplicated: Secondary | ICD-10-CM

## 2011-08-18 DIAGNOSIS — I10 Essential (primary) hypertension: Secondary | ICD-10-CM

## 2011-08-18 NOTE — Progress Notes (Signed)
Subjective:    Patient ID: Martin Haney, male    DOB: October 06, 1945, 66 y.o.   MRN: [id number]  HPI Martin Haney is a pleasant 66 yr Martin Haney male with  A hisory of Lung CA,  CAD,  PAD with aortic aneurysm, CKD and diffuse cerebrovascular disease with history of CVA, who presents for hospital followup following sudden onset of left leg weakness.  This was his second hospitalization this Fall for neurologic symptoms consistent with CVA. No intervention was done given the evaluationthat was done on prior admission and the recommendations to quit smoking, which were not adhered to until last admission. Since discharge this last admission he states that he has taken all of his medications as directed and has only smoked 2 cigarettes.  He is using electric cigarettes and has nicoderm patches but has not been using them. His left leg weakness has resolved significantly ,  he is using a cane for support and has returned to work.   Past Medical History  Diagnosis Date  . Hypertension   . Hyperlipidemia   . Thyroid disease   . Tobacco abuse   . Tobacco abuse counseling   . Myocardial infarction   . CHF (congestive heart failure)     ischemic cardiomyopathy, systolic and diastolic dysfunction  by  ECHO Mar 2011  . Lung Cancer     s/p LUL lobecotmy, remote  . 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 Cerebrovascular disease Sept 2012    diffuse, by CT angiogram    Current Outpatient Prescriptions on File Prior to Visit  Medication Sig Dispense Refill  . aspirin 325 MG EC tablet Take 325 mg by mouth daily.        . budesonide-formoterol (SYMBICORT) 160-4.5 MCG/ACT inhaler Inhale 2 puffs into the lungs 2 (two) times daily.  1 Inhaler  12  . carvedilol (COREG) 25 MG tablet Take 25 mg by mouth 2 (two) times daily with a meal.        . clopidogrel (PLAVIX) 75 MG tablet Take 75 mg by mouth daily.        . hydrALAZINE (APRESOLINE) 50 MG tablet Take 50 mg by mouth 2 (two)  times daily.        . nitroGLYCERIN (NITROSTAT) 0.4 MG SL tablet Place 0.4 mg under the tongue every 5 (five) minutes as needed.        . rosuvastatin (CRESTOR) 20 MG tablet Take 20 mg by mouth daily.        Marland Kitchen spironolactone (ALDACTONE) 25 MG tablet Take 1 tablet (25 mg total) by mouth daily.  30 tablet  5  . Tamsulosin HCl (FLOMAX) 0.4 MG CAPS Take 0.4 mg by mouth daily.        . traMADol (ULTRAM) 50 MG tablet Take 1 tablet (50 mg total) by mouth every 6 (six) hours as needed for pain. Maximum dose= 8 tablets per day  120 tablet  2  . amLODipine (NORVASC) 10 MG tablet Take 1 tablet (10 mg total) by mouth daily.  30 tablet  5  . losartan (COZAAR) 100 MG tablet Take 100 mg by mouth daily.           Review of Systems  Constitutional: Negative for fever, chills, diaphoresis, activity change, appetite change, fatigue and unexpected weight change.  HENT: Negative for hearing loss, ear pain, nosebleeds, congestion, sore throat, facial swelling, rhinorrhea, sneezing, drooling, mouth sores, trouble swallowing, neck pain, neck stiffness, dental problem, voice  change, postnasal drip, sinus pressure, tinnitus and ear discharge.   Eyes: Negative for photophobia, pain, discharge, redness, itching and visual disturbance.  Respiratory: Negative for apnea, cough, choking, chest tightness, shortness of breath, wheezing and stridor.   Cardiovascular: Negative for chest pain, palpitations and leg swelling.  Gastrointestinal: Negative for nausea, vomiting, abdominal pain, diarrhea, constipation, blood in stool, abdominal distention, anal bleeding and rectal pain.  Genitourinary: Negative for dysuria, urgency, frequency, hematuria, flank pain, decreased urine volume, scrotal swelling, difficulty urinating and testicular pain.  Musculoskeletal: Negative for myalgias, back pain, joint swelling, arthralgias and gait problem.  Skin: Negative for color change, rash and wound.  Neurological: Negative for dizziness,  tremors, seizures, syncope, speech difficulty, weakness, light-headedness, numbness and headaches.  Psychiatric/Behavioral: Negative for suicidal ideas, hallucinations, behavioral problems, confusion, sleep disturbance, dysphoric mood, decreased concentration and agitation. The patient is not nervous/anxious.        Objective:   Physical Exam  Constitutional: He is oriented to person, place, and time.  HENT:  Head: Normocephalic and atraumatic.  Mouth/Throat: Oropharynx is clear and moist.  Eyes: Conjunctivae and EOM are normal.  Neck: Normal range of motion. Neck supple. No JVD present. No thyromegaly present.  Cardiovascular: Normal rate, regular rhythm and normal heart sounds.   Pulmonary/Chest: Effort normal and breath sounds normal. He has no wheezes. He has no rales.  Abdominal: Soft. Bowel sounds are normal. He exhibits no mass. There is no tenderness. There is no rebound.  Musculoskeletal: Normal range of motion. He exhibits no edema.  Neurological: He is alert and oriented to person, place, and time. He displays no tremor and normal reflexes. He exhibits normal muscle tone.  Skin: Skin is warm and dry.  Psychiatric: He has a normal mood and affect.          Assessment & Plan:

## 2011-08-18 NOTE — Patient Instructions (Signed)
Your blood pressure is very good. Keep taking all of your medications.    You have recovered well from your stroke.   Return in 3 months

## 2011-08-18 NOTE — Assessment & Plan Note (Addendum)
With recent admission for left sided weakness,  Now significantly imporvoed.  Secondary to low flow state by prior CT angiography .   Contineu tobacco abstinence, Plavix and ASA, and bp control with goal 140/80. Using a cane

## 2011-08-18 NOTE — Assessment & Plan Note (Addendum)
He has made a concerted effort to abstain from tobacco since his last CVA. Encourgement given. Recommended continued use of electric cig and adding the nicoderm  patches

## 2011-08-19 ENCOUNTER — Encounter: Payer: Self-pay | Admitting: Internal Medicine

## 2011-08-19 NOTE — Assessment & Plan Note (Signed)
Controlled per goal of 140/80 to avoid underperfusion of brain due to severe atheroscelrotic disease by CT angiography of brain during prior [facility] admission.

## 2011-09-19 ENCOUNTER — Ambulatory Visit: Payer: Medicare Other | Admitting: Internal Medicine

## 2011-11-02 ENCOUNTER — Telehealth: Payer: Self-pay | Admitting: Internal Medicine

## 2011-11-02 NOTE — Telephone Encounter (Signed)
Mr sagar called he brought by a paper this morning for dr Darrick Huntsman to sign so he can get an appointment with the dentist. Please let me know when this is sent.  He wants to get an appointment with the dentist asap

## 2011-11-02 NOTE — Telephone Encounter (Signed)
The form is in Dr. Melina Schools red folder to sign.

## 2011-11-03 NOTE — Telephone Encounter (Signed)
Form has been faxed, patient notified. 

## 2011-11-16 ENCOUNTER — Ambulatory Visit (INDEPENDENT_AMBULATORY_CARE_PROVIDER_SITE_OTHER): Payer: Managed Care, Other (non HMO) | Admitting: Internal Medicine

## 2011-11-16 ENCOUNTER — Encounter: Payer: Self-pay | Admitting: Internal Medicine

## 2011-11-16 VITALS — BP 122/70 | HR 63 | Temp 97.8°F | Resp 18 | Wt 181.0 lb

## 2011-11-16 DIAGNOSIS — Z716 Tobacco abuse counseling: Secondary | ICD-10-CM

## 2011-11-16 DIAGNOSIS — J329 Chronic sinusitis, unspecified: Secondary | ICD-10-CM

## 2011-11-16 DIAGNOSIS — F17200 Nicotine dependence, unspecified, uncomplicated: Secondary | ICD-10-CM

## 2011-11-16 DIAGNOSIS — I10 Essential (primary) hypertension: Secondary | ICD-10-CM

## 2011-11-16 MED ORDER — AMOXICILLIN-POT CLAVULANATE 875-125 MG PO TABS: 1.0000 | ORAL_TABLET | Freq: Two times a day (BID) | ORAL | Status: AC

## 2011-11-16 NOTE — Progress Notes (Signed)
Patient ID: Martin Haney, male   DOB: 1945-08-31, 66 y.o.   MRN: [id number] Patient Active Problem List  Diagnoses  . TIA (transient ischemic attack)  . Cerebrovascular disease  . Hypertension  . Arteriosclerotic coronary artery disease  . Tobacco abuse  . Tobacco abuse counseling  . CHF (congestive heart failure)  . Lung Cancer  . Chronic kidney disease  . CVA (cerebral vascular accident)  . Sinusitis    Subjective:  CC:   Chief Complaint  Patient presents with  . Follow-up  . Sinusitis    HPI:   Martin Tinninis a 67 y.o. male who presents  4 day history of nasal  congestion accompanied by rhinitis, sneezing and cough productive of clear to yellow sputum.  Denies fevers, facial pain sore throat an dear pain.  Denies dyspnea and wheezing.    Past Medical History  Diagnosis Date  . Hypertension   . Hyperlipidemia   . Thyroid disease   . Tobacco abuse   . Tobacco abuse counseling   . Myocardial infarction   . CHF (congestive heart failure)     ischemic cardiomyopathy, systolic and diastolic dysfunction  by  ECHO Mar 2011  . Lung Cancer     s/p LUL lobecotmy, remote  . 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 Cerebrovascular disease Sept 2012    diffuse, by CT angiogram     Past Surgical History  Procedure Date  . Back surgery     2 operations, herniated disc  . Lobectomy     LUL,   . Cosmetic surgery          The following portions of the patient's history were reviewed and updated as appropriate: Allergies, current medications, and problem list.    Review of Systems:   12 Pt  review of systems was negative except those addressed in the HPI,     History   Social History  . Marital Status: Single    Spouse Name: N/A    Number of Children: N/A  . 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 Smoking status: Former Smoker -- 1.0 packs/day  for 50 years    Types: Cigarettes    Quit date: 08/02/2011  . Smokeless tobacco: Never Used   Comment: pt has quit smoking  . Alcohol Use: 7.0 oz/week    14 drink(s) per week  . Drug Use: No  . Sexually Active: Yes -- Male partner(s)   Other Topics Concern  . 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 ROM normal. No CVA tenderness. Lungs: clear to auscultation bilaterally Heart: regular rate and rhythm, S1, S2 normal, no murmur, click, rub or gallop Abdomen: soft, non-tender; bowel sounds normal; no masses,  no organomegaly Pulses: 2+ and symmetric Skin: Skin color, texture, turgor normal. No rashes or lesions Lymph nodes: Cervical, supraclavicular, and axillary nodes normal.  Assessment  and Plan:  Hypertension Finally controlled with adherence to current regimen.  No changes today.  Sinusitis History and exam is consistent with viral URI complicated by early bacterial infection .  Will treat symptoms with mucolytics, topical decongestnatns and anticholinergics/antihistamines and  add empiric antibiotics.  Reminded to avoid oral  Decongestants but use  saline lavage.  Adding steroid nasal spray if no improvement on current regimen.   Tobacco abuse counseling Encouraged to stay off the cigarettes again.  He is helping himself to few puffs of his girlfriend's cigarette daily, which I have reminded him stll has the same effect on his blood vessels as lighting his  own cigarette.     Updated Medication List Outpatient Encounter Prescriptions as of 11/16/2011  Medication Sig Dispense Refill  . amLODipine (NORVASC) 10 MG tablet Take 1 tablet (10 mg total) by mouth daily.  30 tablet  5  . aspirin 325 MG EC tablet Take 325 mg by mouth daily.        . budesonide-formoterol (SYMBICORT) 160-4.5 MCG/ACT inhaler Inhale 2 puffs into the lungs 2 (two) times daily.  1 Inhaler  12  . carvedilol (COREG) 25 MG tablet Take 25 mg by mouth 2 (two) times daily with a meal.        . clopidogrel (PLAVIX) 75 MG tablet Take 75 mg by mouth daily.        . hydrALAZINE (APRESOLINE) 50 MG tablet Take 50 mg by mouth 2 (two) times daily.        . nitroGLYCERIN (NITROSTAT) 0.4 MG SL tablet Place 0.4 mg under the tongue every 5 (five) minutes as needed.        . rosuvastatin (CRESTOR) 20 MG tablet Take 20 mg by mouth daily.        Marland Kitchen spironolactone (ALDACTONE) 25 MG tablet Take 1 tablet (25 mg total) by mouth daily.  30 tablet  5  . traMADol (ULTRAM) 50 MG tablet Take 1 tablet (50 mg total) by mouth every 6 (six) hours as needed for pain. Maximum dose= 8 tablets per day  120 tablet  2  . amoxicillin-clavulanate (AUGMENTIN) 875-125 MG per tablet Take 1 tablet by mouth 2 (two) times daily.  14 tablet  0  . DISCONTD: losartan (COZAAR) 100 MG tablet Take 100 mg by mouth daily.        Marland Kitchen DISCONTD: Tamsulosin HCl (FLOMAX) 0.4 MG CAPS Take 0.4 mg by mouth daily.           No orders of the defined types were placed in this encounter.    No Follow-up on file.

## 2011-11-16 NOTE — Patient Instructions (Addendum)
You have a sinus/ear infection   .  I am prescribing an antibiotic  To manage the infectin and the inflammation in your sinuses.   I also advise use of the following OTC meds to help with your other symptoms.   Take generic OTC benadryl (dipenhydramine )  25 mg every 8 hours for the drainage,  And use Afrin nasal spray (oxymetazolone) Twice daily on each side for a maximum for 5 days for the congestion.  Flushes your sinuses twice daily with Simply Saline (do over the sink because if you do it right you will spit out globs of mucus)  Use benzonatate capsules or OTC  Delsym   FOR THE COUGH.  Gargle with salt water as needed for sore throat.

## 2011-11-19 ENCOUNTER — Encounter: Payer: Self-pay | Admitting: Internal Medicine

## 2011-11-19 NOTE — Assessment & Plan Note (Addendum)
History and exam is consistent with viral URI complicated by early bacterial infection .  Will treat symptoms with mucolytics, topical decongestnatns and anticholinergics/antihistamines and  add empiric antibiotics.  Reminded to avoid oral  Decongestants but use  saline lavage.  Adding steroid nasal spray if no improvement on current regimen.

## 2011-11-19 NOTE — Assessment & Plan Note (Signed)
Finally controlled with adherence to current regimen.  No changes today.

## 2011-11-19 NOTE — Assessment & Plan Note (Signed)
Encouraged to stay off the cigarettes again.  He is helping himself to few puffs of his girlfriend's cigarette daily, which I have reminded him stll has the same effect on his blood vessels as lighting his own cigarette.

## 2012-01-10 ENCOUNTER — Other Ambulatory Visit: Payer: Self-pay | Admitting: Internal Medicine

## 2012-04-09 ENCOUNTER — Other Ambulatory Visit: Payer: Self-pay | Admitting: Emergency Medicine

## 2012-04-09 DIAGNOSIS — I059 Rheumatic mitral valve disease, unspecified: Secondary | ICD-10-CM

## 2012-04-11 ENCOUNTER — Telehealth: Payer: Self-pay | Admitting: Internal Medicine

## 2012-04-11 NOTE — Telephone Encounter (Signed)
Hospital follow up [facility] discharge 04/24/12 [facility] @ [facility] stated that mr Sarvis needs referral to nuerogology Victorino Dike will fax notes

## 2012-04-11 NOTE — Telephone Encounter (Signed)
I tried calling patient, the home phone just kept ringing and the cell number does not have a voicemail that has been set up.  I will try again.

## 2012-04-12 ENCOUNTER — Telehealth: Payer: Self-pay | Admitting: Internal Medicine

## 2012-04-12 DIAGNOSIS — I639 Cerebral infarction, unspecified: Secondary | ICD-10-CM

## 2012-04-12 NOTE — Telephone Encounter (Signed)
Speech therapy ordered.  Patient's phone numbers in chart do no match hospital info,  Please update with  [phone]   Sisters  # is [phone].  Patient needs appt for hospital followup  In 2 weeks

## 2012-04-12 NOTE — Telephone Encounter (Signed)
Martin Haney called needs order for speech therphy  For cva and dysphgia

## 2012-04-12 NOTE — Telephone Encounter (Signed)
Dr. Darrick Huntsman obtained two other numbers to get in touch with patient in another telephone encounter.  I will try those numbers.

## 2012-04-12 NOTE — Telephone Encounter (Signed)
Order has been faxed.  I will call those numbers and try to contact patient.  The hospital follow up notification is through another telephone encounter.

## 2012-04-12 NOTE — Telephone Encounter (Signed)
Martin Haney from [facility] called and needs an order for speech therapy.  Patients had CVA and dysphagia.  Please advise.

## 2012-04-16 NOTE — Telephone Encounter (Signed)
I tried calling other two numbers Dr. Darrick Huntsman provided and one was disconnected, the other I left a message asking patient to return my call.

## 2012-04-24 ENCOUNTER — Encounter: Payer: Self-pay | Admitting: Internal Medicine

## 2012-04-24 ENCOUNTER — Ambulatory Visit (INDEPENDENT_AMBULATORY_CARE_PROVIDER_SITE_OTHER): Payer: Medicare Other | Admitting: Internal Medicine

## 2012-04-24 VITALS — BP 156/88 | HR 60 | Temp 97.8°F | Resp 16 | Wt 175.8 lb

## 2012-04-24 DIAGNOSIS — F17200 Nicotine dependence, unspecified, uncomplicated: Secondary | ICD-10-CM

## 2012-04-24 DIAGNOSIS — R972 Elevated prostate specific antigen [PSA]: Secondary | ICD-10-CM

## 2012-04-24 DIAGNOSIS — Z720 Tobacco use: Secondary | ICD-10-CM

## 2012-04-24 DIAGNOSIS — Z125 Encounter for screening for malignant neoplasm of prostate: Secondary | ICD-10-CM

## 2012-04-24 DIAGNOSIS — I639 Cerebral infarction, unspecified: Secondary | ICD-10-CM

## 2012-04-24 DIAGNOSIS — N400 Enlarged prostate without lower urinary tract symptoms: Secondary | ICD-10-CM

## 2012-04-24 DIAGNOSIS — I10 Essential (primary) hypertension: Secondary | ICD-10-CM

## 2012-04-24 DIAGNOSIS — I6350 Cerebral infarction due to unspecified occlusion or stenosis of unspecified cerebral artery: Secondary | ICD-10-CM

## 2012-04-24 DIAGNOSIS — I2510 Atherosclerotic heart disease of native coronary artery without angina pectoris: Secondary | ICD-10-CM

## 2012-04-24 DIAGNOSIS — Z79899 Other long term (current) drug therapy: Secondary | ICD-10-CM

## 2012-04-24 DIAGNOSIS — R339 Retention of urine, unspecified: Secondary | ICD-10-CM

## 2012-04-24 DIAGNOSIS — E785 Hyperlipidemia, unspecified: Secondary | ICD-10-CM

## 2012-04-24 NOTE — Progress Notes (Signed)
Patient ID: Martin Haney, male   DOB: 04/11/1946, 66 y.o.   MRN: [id number]   Patient Active Problem List  Diagnosis  . TIA (transient ischemic attack)  . Cerebrovascular disease  . Hypertension  . Arteriosclerotic coronary artery disease  . Tobacco abuse  . Tobacco abuse counseling  . CHF (congestive heart failure)  . Lung Cancer  . Chronic kidney disease  . CVA (cerebral vascular accident)  . Sinusitis    Subjective:  CC:   No chief complaint on file.   HPI:   Martin Tinninis a 66 y.o. male who presents for Hospital follow up for recent recurrent CVA . He presented with right-sided weakness and slurred speech accompanied by dysphagia and was admitted to [facility] in mid August. He was discharged on August 11.Marland Kitchen His symptoms have improved but not resolved. He has been referred for Speech therapy and dysphagia diet his in place.   He reports that he is not smoking .  He has been having  Trouble urinating even when in the hospital due to hesitancy and decreased stream. He denies dysuria.  This was evaluated during his hospitalization to rule out a urinary tract infection. He has been taking all his medications as prescribed  Past Medical History  Diagnosis Date  . Hypertension   . Hyperlipidemia   . Thyroid disease   . Tobacco abuse   . Tobacco abuse counseling   . Myocardial infarction   . CHF (congestive heart failure)     ischemic cardiomyopathy, systolic and diastolic dysfunction  by  ECHO Mar 2011  . Lung Cancer     s/p LUL lobecotmy, remote  . 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 Cerebrovascular disease Sept 2012    diffuse, by CT angiogram     Past Surgical History  Procedure Date  . Back surgery     2 operations, herniated disc  . Lobectomy     LUL,   . Cosmetic surgery          The following portions of the patient's history were reviewed and updated as appropriate: Allergies, current medications, and problem  list.    Review of Systems:   A comprehensive ROS was done and positive for slurred speech, mild dysphagia and right leg weakness, aall of which has imporved   The rest was negative.    History   Social History  . Marital Status: Single    Spouse Name: N/A    Number of Children: N/A  . 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 Smoking status: Former Smoker -- 1.0 packs/day for 50 years    Types: Cigarettes    Quit date: 08/02/2011  . Smokeless tobacco: Never Used   Comment: pt has quit smoking  . Alcohol Use: 7.0 oz/week    14 drink(s) per week  . Drug Use: No  . Sexually Active: Yes -- Male partner(s)   Other Topics Concern  . 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 ROM normal. No CVA tenderness. Lungs: clear to auscultation bilaterally Heart: regular rate and rhythm, S1, S2 normal, no murmur, click, rub or gallop Abdomen: soft, non-tender; bowel sounds normal; no masses,  no organomegaly Pulses: 2+ and symmetric Skin: Skin color, texture, turgor normal. No rashes or lesions Lymph nodes: Cervical, supraclavicular, and axillary nodes normal. Neuro: Slight slurring of speech. Right leg strength 4+ out of 5.  he walks without a foot drop.  Assessment and Plan:  CVA (cerebral vascular accident) Recurrent with prior strokes the same side. He has had neurologic and vascular evaluations done in the past and there are no opportunities for intervention other than continued tobacco cessation  continued use of Plavix, and permissive mild hypertension  given his low flow state.  Continue speech therapy. Continue dysphagia diet.  Tobacco abuse He has had difficulty sustaining tobacco cessation in the past 2 years despite having recurrent strokes. He states he is currently abstinent. He does not on medications. Prior trial NicoDerm patches caused severe nausea; this is likely because he smoked while he was using at  Arteriosclerotic coronary artery disease Secondary to a long history of tobacco use and hypertension. Continue current medications.  Hypertension His blood pressure is elevated today. I discussed that given the severity of his cerebrovascular disease he has been striving for optimal blood pressures may actually be counterproductive. We should try to keep his pressure under 160.   Updated Medication List Outpatient Encounter Prescriptions as of 04/24/2012  Medication Sig Dispense Refill  . amLODipine (NORVASC) 10 MG tablet Take 1 tablet (10 mg total) by mouth daily.  30 tablet  5  . aspirin 325 MG EC tablet Take 325 mg by mouth daily.        . budesonide-formoterol (SYMBICORT) 160-4.5 MCG/ACT inhaler Inhale 2 puffs into the lungs 2 (two) times daily.  1 Inhaler  12  . carvedilol (COREG) 25 MG tablet TAKE ONE TABLET BY MOUTH TWICE DAILY  60 tablet  5  . hydrALAZINE (APRESOLINE) 50 MG tablet TAKE ONE TABLET BY MOUTH THREE TIMES DAILY  60 tablet  3  . nitroGLYCERIN (NITROSTAT) 0.4 MG SL tablet Place 0.4 mg under the tongue every 5 (five) minutes as needed.        . rosuvastatin (CRESTOR) 20 MG tablet Take 20 mg by mouth daily.        Marland Kitchen spironolactone (ALDACTONE) 25 MG tablet Take 1 tablet (25 mg total) by mouth daily.  30 tablet  5  . traMADol (ULTRAM) 50 MG tablet Take 1 tablet (50 mg total) by mouth every 6 (six) hours as needed for pain. Maximum dose= 8 tablets per day  120 tablet  2  . DISCONTD: clopidogrel (PLAVIX) 75 MG tablet Take 75 mg by mouth daily.            Orders Placed This Encounter  Procedures  . PSA, Medicare  . LDL cholesterol, direct  . Comprehensive metabolic panel  . Urinalysis    No Follow-up on file.

## 2012-04-24 NOTE — Patient Instructions (Addendum)
We are checking your urine to make sure you don't have an infection  We will call in a medicine to help shrink your prostate so you have an easier time emptying your bladder.   It is ok to let your blood pressure go up to 150 to 160.    Return in 3 months

## 2012-04-25 LAB — COMPREHENSIVE METABOLIC PANEL
ALT: 10 U/L (ref 0–53)
AST: 15 U/L (ref 0–37)
Albumin: 4.2 g/dL (ref 3.5–5.2)
CO2: 24 mEq/L (ref 19–32)
Calcium: 11.4 mg/dL — ABNORMAL HIGH (ref 8.4–10.5)
Chloride: 114 mEq/L — ABNORMAL HIGH (ref 96–112)
GFR: 54.3 mL/min — ABNORMAL LOW (ref >60.00)
Potassium: 4.6 mEq/L (ref 3.5–5.1)
Total Protein: 8.2 g/dL (ref 6.0–8.3)

## 2012-04-25 LAB — URINALYSIS
Bilirubin Urine: NEGATIVE
Ketones, ur: NEGATIVE
Leukocytes, UA: NEGATIVE
Urine Glucose: NEGATIVE
Urobilinogen, UA: 1 (ref 0.0–1.0)

## 2012-04-26 NOTE — Assessment & Plan Note (Signed)
Recurrent with prior strokes the same side. He has had neurologic and vascular evaluations done in the past and there are no opportunities for intervention other than continued tobacco cessation continued use of Plavix, and permissive mild hypertension  given his low flow state.  Continue speech therapy. Continue dysphagia diet.

## 2012-04-26 NOTE — Assessment & Plan Note (Signed)
He has had difficulty sustaining tobacco cessation in the past 2 years despite having recurrent strokes. He states he is currently abstinent. He does not on medications. Prior trial NicoDerm patches caused severe nausea; this is likely because he smoked while he was using at

## 2012-04-26 NOTE — Assessment & Plan Note (Signed)
Secondary to a long history of tobacco use and hypertension. Continue current medications.

## 2012-04-26 NOTE — Assessment & Plan Note (Signed)
His blood pressure is elevated today. I discussed that given the severity of his cerebrovascular disease he has been striving for optimal blood pressures may actually be counterproductive. We should try to keep his pressure under 160.

## 2012-04-29 NOTE — Addendum Note (Signed)
Addended by: Duncan Dull on: 04/29/2012 01:20 PM   Modules accepted: Orders

## 2012-05-14 ENCOUNTER — Telehealth: Payer: Self-pay | Admitting: Internal Medicine

## 2012-05-14 NOTE — Telephone Encounter (Signed)
Patient needing another neurologist other than the physicians at  [facility]. Who would you recommend?

## 2012-05-15 NOTE — Telephone Encounter (Signed)
Dr. Angela Adam,  Thank you

## 2012-05-15 NOTE — Telephone Encounter (Signed)
Forwarding to Dr.Tullo 

## 2012-05-16 ENCOUNTER — Ambulatory Visit: Payer: Managed Care, Other (non HMO) | Admitting: Internal Medicine

## 2012-05-28 ENCOUNTER — Encounter: Payer: Self-pay | Admitting: Internal Medicine

## 2012-07-25 ENCOUNTER — Ambulatory Visit (INDEPENDENT_AMBULATORY_CARE_PROVIDER_SITE_OTHER): Payer: Medicare Other | Admitting: Internal Medicine

## 2012-07-31 ENCOUNTER — Ambulatory Visit (INDEPENDENT_AMBULATORY_CARE_PROVIDER_SITE_OTHER): Payer: Medicare Other | Admitting: Internal Medicine

## 2012-07-31 ENCOUNTER — Encounter: Payer: Self-pay | Admitting: Internal Medicine

## 2012-07-31 VITALS — BP 160/78 | HR 63 | Temp 97.5°F | Ht 73.0 in | Wt 174.5 lb

## 2012-07-31 DIAGNOSIS — F17200 Nicotine dependence, unspecified, uncomplicated: Secondary | ICD-10-CM

## 2012-07-31 DIAGNOSIS — Z716 Tobacco abuse counseling: Secondary | ICD-10-CM

## 2012-07-31 DIAGNOSIS — Z7189 Other specified counseling: Secondary | ICD-10-CM

## 2012-07-31 DIAGNOSIS — I10 Essential (primary) hypertension: Secondary | ICD-10-CM

## 2012-07-31 DIAGNOSIS — Z79899 Other long term (current) drug therapy: Secondary | ICD-10-CM

## 2012-07-31 DIAGNOSIS — R5383 Other fatigue: Secondary | ICD-10-CM

## 2012-07-31 DIAGNOSIS — Z1331 Encounter for screening for depression: Secondary | ICD-10-CM

## 2012-07-31 DIAGNOSIS — R6882 Decreased libido: Secondary | ICD-10-CM

## 2012-07-31 DIAGNOSIS — N529 Male erectile dysfunction, unspecified: Secondary | ICD-10-CM

## 2012-07-31 DIAGNOSIS — Z720 Tobacco use: Secondary | ICD-10-CM

## 2012-07-31 LAB — COMPREHENSIVE METABOLIC PANEL
AST: 14 U/L (ref 0–37)
Albumin: 4.1 g/dL (ref 3.5–5.2)
Alkaline Phosphatase: 84 U/L (ref 39–117)
BUN: 17 mg/dL (ref 6–23)
Calcium: 11 mg/dL — ABNORMAL HIGH (ref 8.4–10.5)
Creatinine, Ser: 1.5 mg/dL (ref 0.4–1.5)
Glucose, Bld: 75 mg/dL (ref 70–99)
Potassium: 4 mEq/L (ref 3.5–5.1)

## 2012-07-31 MED ORDER — CLOPIDOGREL BISULFATE 75 MG PO TABS: 75.0000 mg | ORAL_TABLET | Freq: Every day | ORAL | Status: DC

## 2012-07-31 NOTE — Progress Notes (Signed)
Patient ID: Martin Haney, male   DOB: 08/02/1946, 66 y.o.   MRN: [id number]  Patient Active Problem List  Diagnosis  . TIA (transient ischemic attack)  . Cerebrovascular disease  . Hypertension  . Arteriosclerotic coronary artery disease  . Tobacco abuse  . Tobacco abuse counseling  . CHF (congestive heart failure)  . Lung Cancer  . Chronic kidney disease  . CVA (cerebral vascular accident)  . Sinusitis  . Hypercalcemia  . Erectile dysfunction    Subjective:  CC:   Chief Complaint  Patient presents with  . Follow-up    HPI:   Martin Tinninis a 66 y.o. male who presents  For three-month followup on hypertension, peripheral vascular disease, coronary artery disease with ischemic cardiomyopathy, and tobacco addiction. Marland Kitchen He still bothered by his dysarthria from his previous stroke. He did undergo speech therapy but not anymore. He continues to smoke "every once in a while". He is having some erectile dysfunction is requesting samples or medication.    Past Medical History  Diagnosis Date  . Hypertension   . Hyperlipidemia   . Thyroid disease   . Tobacco abuse   . Tobacco abuse counseling   . Myocardial infarction   . CHF (congestive heart failure)     ischemic cardiomyopathy, systolic and diastolic dysfunction  by  ECHO Mar 2011  . Lung Cancer     s/p LUL lobecotmy, remote  . 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 Cerebrovascular disease Sept 2012    diffuse, by CT angiogram     Past Surgical History  Procedure Date  . Back surgery     2 operations, herniated disc  . Lobectomy     LUL,   . Cosmetic surgery          The following portions of the patient's history were reviewed and updated as appropriate: Allergies, current medications, and problem list.    Review of Systems:   12 Pt  review of systems was negative except those addressed in the HPI,     History   Social History  . Marital Status: Single   Spouse Name: N/A    Number of Children: N/A  . 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 Smoking status: Former Smoker -- 1.0 packs/day for 50 years    Types: Cigarettes    Quit date: 08/02/2011  . Smokeless tobacco: Never Used     Comment: pt has quit smoking  . Alcohol Use: 7.0 oz/week    14 drink(s) per week  . Drug Use: No  . Sexually Active: Yes -- Male partner(s)   Other Topics Concern  . 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 ROM normal. No CVA tenderness. Lungs: clear to auscultation bilaterally Heart: regular rate and rhythm, S1, S2 normal, no murmur, click, rub or  gallop Abdomen: soft, non-tender; bowel sounds normal; no masses,  no organomegaly Pulses: 2+ and symmetric Skin: Skin color, texture, turgor normal. No rashes or lesions Lymph nodes: Cervical, supraclavicular, and axillary nodes normal.  Assessment and Plan:  Hypercalcemia Serum calcium level has been persistently elevated,  Not on HCTZ.  History of lung Ca.  Will check ionized ca, PTH and PTH RP   Tobacco abuse Still smoking occasionally despite repeated warnings of the risk of recurrent stroke.  Tobacco abuse counseling Again encouraged to abstain from smoking completely  Hypertension Blood pressure is elevated but we are pending some elevation due to his underperfused brain due  to diffuse severe cerebrovascular disease.  Erectile dysfunction Testosterone levels will were normal. Etiology likely peripheral vascular disease. He is requesting samples of Cialis which I do not have. I recommended that he get his eye exam first to make sure he does not have glaucoma.   Updated Medication List Outpatient Encounter Prescriptions as of 07/31/2012  Medication Sig Dispense Refill  . amLODipine (NORVASC) 10 MG tablet Take 1 tablet (10 mg total) by mouth daily.  30 tablet  5  . aspirin 325 MG EC tablet Take 325 mg by mouth daily.        . carvedilol (COREG) 25 MG tablet TAKE ONE TABLET BY MOUTH TWICE DAILY  60 tablet  5  . hydrALAZINE (APRESOLINE) 50 MG tablet TAKE ONE TABLET BY MOUTH THREE TIMES DAILY  60 tablet  3  . rosuvastatin (CRESTOR) 20 MG tablet Take 20 mg by mouth daily.        . budesonide-formoterol (SYMBICORT) 160-4.5 MCG/ACT inhaler Inhale 2 puffs into the lungs 2 (two) times daily.  1 Inhaler  12  . clopidogrel (PLAVIX) 75 MG tablet Take 1 tablet (75 mg total) by mouth daily.  90 tablet  3  . nitroGLYCERIN (NITROSTAT) 0.4 MG SL tablet Place 0.4 mg under the tongue every 5 (five) minutes as needed.        Marland Kitchen spironolactone (ALDACTONE) 25 MG tablet Take 1 tablet (25 mg total) by mouth daily.  30 tablet  5     Orders Placed This Encounter  Procedures  . Comprehensive metabolic panel  . Testosterone, free, total  . Calcium, ionized  . PTH, intact (no Ca)  . PTH-related peptide    No Follow-up on file.

## 2012-07-31 NOTE — Patient Instructions (Signed)
You should be taking aspirin AND Plavix to prevent more strokes.  I have called the Plavix to your pharmacy   PLEASE DO NOT SMOKE AT ALL  Continue all of your blood pressure medications

## 2012-08-01 ENCOUNTER — Encounter: Payer: Self-pay | Admitting: Internal Medicine

## 2012-08-01 DIAGNOSIS — N529 Male erectile dysfunction, unspecified: Secondary | ICD-10-CM | POA: Insufficient documentation

## 2012-08-01 LAB — TESTOSTERONE, FREE, TOTAL, SHBG
Sex Hormone Binding: 68 nmol/L (ref 13–71)
Testosterone, Free: 60.7 pg/mL (ref 47.0–244.0)
Testosterone: 483.46 ng/dL (ref 300–890)

## 2012-08-01 NOTE — Assessment & Plan Note (Signed)
Still smoking occasionally despite repeated warnings of the risk of recurrent stroke.

## 2012-08-01 NOTE — Assessment & Plan Note (Signed)
Testosterone levels will were normal. Etiology likely peripheral vascular disease. He is requesting samples of Cialis which I do not have. I recommended that he get his eye exam first to make sure he does not have glaucoma.

## 2012-08-01 NOTE — Assessment & Plan Note (Signed)
Blood pressure is elevated but we are pending some elevation due to his underperfused brain due to diffuse severe cerebrovascular disease.

## 2012-08-01 NOTE — Assessment & Plan Note (Signed)
Again encouraged to abstain from smoking completely

## 2012-08-01 NOTE — Assessment & Plan Note (Addendum)
Serum calcium level has been persistently elevated,  Not on HCTZ.  History of lung Ca.  Will check ionized ca, PTH and PTH RP

## 2012-08-05 ENCOUNTER — Other Ambulatory Visit (INDEPENDENT_AMBULATORY_CARE_PROVIDER_SITE_OTHER): Payer: Medicare Other

## 2012-08-05 DIAGNOSIS — E213 Hyperparathyroidism, unspecified: Secondary | ICD-10-CM

## 2012-08-09 ENCOUNTER — Telehealth: Payer: Self-pay | Admitting: Internal Medicine

## 2012-08-09 NOTE — Telephone Encounter (Signed)
Labs show elevated PTH at 75 and elevated ionized Ca at 6.  This is consistent with primary hyperparathyroidism.  Can you please make sure that pt has follow up

## 2012-08-09 NOTE — Telephone Encounter (Signed)
Called pt could not get an answer. Sent message to Martin Haney to keep trying to schedule appt.

## 2012-08-09 NOTE — Telephone Encounter (Signed)
Follow up needs to be next week if possible to discuss further evaluation

## 2012-08-13 NOTE — Telephone Encounter (Signed)
scheduled

## 2012-08-19 ENCOUNTER — Encounter: Payer: Self-pay | Admitting: Internal Medicine

## 2012-08-19 ENCOUNTER — Ambulatory Visit (INDEPENDENT_AMBULATORY_CARE_PROVIDER_SITE_OTHER): Payer: Medicare Other | Admitting: Internal Medicine

## 2012-08-19 VITALS — BP 164/90 | HR 76 | Temp 97.5°F | Resp 16 | Wt 174.5 lb

## 2012-08-19 DIAGNOSIS — I10 Essential (primary) hypertension: Secondary | ICD-10-CM

## 2012-08-19 DIAGNOSIS — E213 Hyperparathyroidism, unspecified: Secondary | ICD-10-CM

## 2012-08-19 DIAGNOSIS — N529 Male erectile dysfunction, unspecified: Secondary | ICD-10-CM

## 2012-08-19 MED ORDER — TADALAFIL 20 MG PO TABS: 10.0000 mg | ORAL_TABLET | ORAL | Status: DC | PRN

## 2012-08-19 NOTE — Progress Notes (Signed)
Patient ID: Martin Haney, male   DOB: 28-Nov-1945, 67 y.o.   MRN: [id number]  Patient Active Problem List  Diagnosis  . TIA (transient ischemic attack)  . Cerebrovascular disease  . Hypertension  . Arteriosclerotic coronary artery disease  . Tobacco abuse  . Tobacco abuse counseling  . CHF (congestive heart failure)  . Lung Cancer  . Chronic kidney disease  . CVA (cerebral vascular accident)  . Hypercalcemia  . Erectile dysfunction    Subjective:  CC:   Chief Complaint  Patient presents with  . Follow-up    Labs    HPI:   Martin Tinninis a 67 y.o. male who presents Follow up on recently diagnosed hypercalcemia. He has a history of lung  CA with resection in 2003, no chemo/XRT.   His care was in [place] at the time bc there were no surgeons here then.   he remembers being told something was wrong with his thyroid then but nothing was done about it. .   he has not had any unintentional weight loss. He has a chronic cough. He continues to smoke  not daily , pack lasts a week. Continues to have deficits from his last stroke. His primary concern is his inability to sustain an erection  And is requesting assistance in the form of medications or testosterone injections. He did not take medications this morning   Past Medical History  Diagnosis Date  . Hypertension   . Hyperlipidemia   . Thyroid disease   . Tobacco abuse   . Tobacco abuse counseling   . Myocardial infarction   . CHF (congestive heart failure)     ischemic cardiomyopathy, systolic and diastolic dysfunction  by  ECHO Mar 2011  . Lung Cancer     s/p LUL lobecotmy, remote  . 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 Cerebrovascular disease Sept 2012    diffuse, by CT angiogram   . Stroke     Past Surgical History  Procedure Date  . Back surgery     2 operations, herniated disc  . Lobectomy     LUL,   . Cosmetic surgery          The following portions of the  patient's history were reviewed and updated as appropriate: Allergies, current medications, and problem list.    Review of Systems:   Patient denies headache, fevers, malaise, unintentional weight loss, skin rash, eye pain, sinus congestion and sinus pain, sore throat, dysphagia,  hemoptysis , cough, dyspnea, wheezing, chest pain, palpitations, orthopnea, edema, abdominal pain, nausea, melena, diarrhea, constipation, flank pain, dysuria, hematuria, urinary  Frequency, nocturia, numbness, tingling, seizures,  Focal weakness, Loss of consciousness,  Tremor, insomnia, depression, anxiety, and suicidal ideation.      History   Social History  . Marital Status: Single    Spouse Name: N/A    Number of Children: N/A  . 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 Smoking status: Former Smoker -- 1.0 packs/day for 50 years    Types: Cigarettes    Quit date: 08/02/2011  . Smokeless tobacco: Never Used     Comment: pt has quit smoking  . Alcohol Use: 7.0 oz/week    14 drink(s) per week  . Drug Use: No  . Sexually Active: Yes -- Male partner(s)   Other Topics Concern  . 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 ROM normal. No CVA tenderness. Lungs: clear to auscultation bilaterally Heart: regular rate and rhythm, S1, S2 normal, no murmur, click, rub or gallop Abdomen: soft, non-tender; bowel sounds  normal; no masses,  no organomegaly Pulses: 2+ and symmetric Skin: Skin color, texture, turgor normal. No rashes or lesions Lymph nodes: Cervical, supraclavicular, and axillary nodes normal.  Assessment and Plan:  Hypercalcemia With inappropriately elevated PTH and elevated ionized calcium. PTH related peptide is negative. Refer to endocrinology for medical management if possible.  He would be considered very high risk for surgical management given his advanced atherosclerotic cerebrovascular disease.  Hypertension  Suboptimally controlled. Patient continues to smoke. He has extremely low flow state due to the advanced her vascular disease by prior CT angiography. It would be counterproductive to get his blood pressure to goal at this point.  Erectile dysfunction Testosterone level is normal and given his advanced atherosclerotic disease I would not treat anyway.. Rx for Cialis sent to pharmacy.   Updated Medication List Outpatient Encounter Prescriptions as of 08/19/2012  Medication Sig Dispense Refill  . amLODipine (NORVASC) 10 MG tablet Take 1 tablet (10 mg total) by mouth daily.  30 tablet  5  . aspirin 325 MG EC tablet Take 325 mg by mouth daily.        . carvedilol (COREG) 25 MG tablet TAKE ONE TABLET BY MOUTH TWICE DAILY  60 tablet  5  . clopidogrel (PLAVIX) 75 MG tablet Take 1 tablet (75 mg total) by mouth daily.  90 tablet  3  . hydrALAZINE (APRESOLINE) 50 MG tablet TAKE ONE TABLET BY MOUTH THREE TIMES DAILY  60 tablet  3  . nitroGLYCERIN (NITROSTAT) 0.4 MG SL tablet Place 0.4 mg under the tongue every 5 (five) minutes as needed.        Marland Kitchen PROSCAR 5 MG tablet       . rosuvastatin (CRESTOR) 20 MG tablet Take 20 mg by mouth daily.        . budesonide-formoterol (SYMBICORT) 160-4.5 MCG/ACT inhaler Inhale 2 puffs into the lungs 2 (two) times daily.  1 Inhaler  12  . spironolactone (ALDACTONE) 25 MG tablet Take 1 tablet (25 mg total) by mouth daily.  30 tablet  5  . tadalafil (CIALIS)  20 MG tablet Take 0.5-1 tablets (10-20 mg total) by mouth every other day as needed for erectile dysfunction.  5 tablet  11     Orders Placed This Encounter  Procedures  . Ambulatory referral to Endocrinology    No Follow-up on file.

## 2012-08-19 NOTE — Patient Instructions (Addendum)
Your calcium level is high,  This can be caused by too much parathyroid hormone, which your blood work suggests. I am referring you to an endocrinologist  (doctor who specializes in this kind of problem) to confirm the diagnosis, since other diseases can also cause this problem  Your testosterone level is normal.  You do not qualify for  a shot  Cialis prescription sent to pharmacy

## 2012-08-19 NOTE — Assessment & Plan Note (Signed)
Suboptimally controlled. Patient continues to smoke. He has extremely low flow state due to the advanced her vascular disease by prior CT angiography. It would be counterproductive to get his blood pressure to goal at this point.

## 2012-08-19 NOTE — Assessment & Plan Note (Signed)
Testosterone level is normal and given his advanced atherosclerotic disease I would not treat anyway.. Rx for Cialis sent to pharmacy.

## 2012-08-19 NOTE — Assessment & Plan Note (Signed)
With inappropriately elevated PTH and elevated ionized calcium. PTH related peptide is negative. Refer to endocrinology for medical management if possible.  He would be considered very high risk for surgical management given his advanced atherosclerotic cerebrovascular disease.

## 2012-08-22 ENCOUNTER — Encounter: Payer: Self-pay | Admitting: Internal Medicine

## 2012-09-03 NOTE — Addendum Note (Signed)
Addended by: Sherlene Shams on: 09/03/2012 06:39 AM   Modules accepted: Orders

## 2012-09-12 ENCOUNTER — Ambulatory Visit: Payer: Medicare Other | Admitting: Internal Medicine

## 2012-09-17 ENCOUNTER — Other Ambulatory Visit: Payer: Self-pay | Admitting: Internal Medicine

## 2012-09-18 NOTE — Telephone Encounter (Signed)
Med filled.  

## 2012-09-19 ENCOUNTER — Ambulatory Visit: Payer: Medicare Other | Admitting: Internal Medicine

## 2012-10-16 ENCOUNTER — Ambulatory Visit: Payer: Medicare Other | Admitting: Internal Medicine

## 2012-10-25 ENCOUNTER — Ambulatory Visit: Payer: Medicare Other | Admitting: Internal Medicine

## 2012-10-28 ENCOUNTER — Ambulatory Visit: Payer: Medicare Other | Admitting: Internal Medicine

## 2012-10-29 ENCOUNTER — Ambulatory Visit: Payer: Medicare Other | Admitting: Internal Medicine

## 2012-10-30 ENCOUNTER — Encounter: Payer: Self-pay | Admitting: Internal Medicine

## 2012-10-30 ENCOUNTER — Ambulatory Visit (INDEPENDENT_AMBULATORY_CARE_PROVIDER_SITE_OTHER): Payer: Medicare Other | Admitting: Internal Medicine

## 2012-10-30 VITALS — BP 146/80 | HR 65 | Temp 98.0°F | Resp 16 | Wt 174.8 lb

## 2012-10-30 DIAGNOSIS — Z716 Tobacco abuse counseling: Secondary | ICD-10-CM

## 2012-10-30 DIAGNOSIS — E213 Hyperparathyroidism, unspecified: Secondary | ICD-10-CM

## 2012-10-30 LAB — BASIC METABOLIC PANEL
CO2: 25 mEq/L (ref 19–32)
Chloride: 107 mEq/L (ref 96–112)
Creatinine, Ser: 1.3 mg/dL (ref 0.4–1.5)
Glucose, Bld: 96 mg/dL (ref 70–99)
Sodium: 138 mEq/L (ref 135–145)
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
## 2012-10-31 ENCOUNTER — Encounter: Payer: Self-pay | Admitting: Internal Medicine

## 2012-10-31 LAB — CALCIUM, IONIZED: Calcium, Ion: 1.56 mmol/L — ABNORMAL HIGH (ref 1.12–1.32)

## 2012-10-31 NOTE — Assessment & Plan Note (Signed)
Patient is currently asymptomatic and is on the appropriate medications. However he continues to smoke and is aware of the risks of continued tobacco abuse given his prior cardiac events. No changes today.

## 2012-10-31 NOTE — Assessment & Plan Note (Signed)
Secondary to hyperparathyroidism. Repeat calcium level is only minimally elevated. We will repeat the endocrinology referral.

## 2012-10-31 NOTE — Assessment & Plan Note (Signed)
Spent 3 minutes discussing risk of continued tobacco abuse, including but not limited to additional events.  He is not interested in pharmacotherapy at this time.

## 2012-10-31 NOTE — Assessment & Plan Note (Addendum)
Diffuse with multiple CVAs and TIAs in the last several years. Patient is on maximal therapy with aspirin, Plavix , antihypertensives and Crestor.  He continues to smoke daily despite numerous admonishments and risks outlined in detail I prior visits as well as today. A goal of blood pressure management is not 130/80 given his low flow state.

## 2012-11-07 ENCOUNTER — Other Ambulatory Visit: Payer: Self-pay | Admitting: Internal Medicine

## 2012-11-07 MED ORDER — ALBUTEROL SULFATE HFA 108 (90 BASE) MCG/ACT IN AERS: 2.0000 | INHALATION_SPRAY | Freq: Four times a day (QID) | RESPIRATORY_TRACT | Status: DC | PRN

## 2012-11-07 MED ORDER — ATORVASTATIN CALCIUM 80 MG PO TABS: 80.0000 mg | ORAL_TABLET | Freq: Every day | ORAL | Status: DC

## 2012-11-11 ENCOUNTER — Ambulatory Visit (INDEPENDENT_AMBULATORY_CARE_PROVIDER_SITE_OTHER): Payer: Medicare Other | Admitting: Internal Medicine

## 2012-11-11 ENCOUNTER — Encounter: Payer: Self-pay | Admitting: Internal Medicine

## 2012-11-11 DIAGNOSIS — E559 Vitamin D deficiency, unspecified: Secondary | ICD-10-CM

## 2012-11-11 DIAGNOSIS — E213 Hyperparathyroidism, unspecified: Secondary | ICD-10-CM

## 2012-11-11 LAB — PHOSPHORUS: Phosphorus: 2.9 mg/dL (ref 2.3–4.6)

## 2012-11-11 LAB — T4, FREE: Free T4: 1.07 ng/dL (ref 0.60–1.60)

## 2012-11-11 NOTE — Progress Notes (Signed)
Subjective:     Patient ID: Martin Haney, male   DOB: 11-24-1945, 67 y.o.   MRN: [id number]  HPI Martin Haney is a pleasant 67 year old man, referred by his PCP, Dr. Darrick Huntsman, for management of hypercalcemia in the context of primary hyperparathyroidism.   He was told in 2004 that he might have a thyroid/parthyroid (?) problem, by Dr. in [place]. He started to see Dr. Darrick Huntsman in ~2011 after he had a light heart attack. He has been followed for his hypercalcemia, which recent tests showing an increased level of parathyroid hormone, with fluctuating levels of calcium, between 10.2-11.4: PTH  Date Value Range Status  10/30/2012 179.0* 14.0 - 72.0 pg/mL Final     Calcium  Date Value Range Status  10/30/2012 10.5  8.4 - 10.5 mg/dL Final  14/78/2956 21.3* 8.4 - 10.5 mg/dL Final  0/86/5784 69.6* 8.4 - 10.5 mg/dL Final  29/52/8413 24.4  8.4 - 10.5 mg/dL Final  01/0/2725 36.6* 8.4 - 10.5 mg/dL Final   Last set of labs, from 10/30/2012 shows a calcium of 10.5 and 10.7, PTH 179, ionized calcium of 1.56 (1.12-1.32), and a normal BMP, with an improved GFR, of 72, and  BUN/creatinine of 12/1.3. A PTH RP was previously tested and it was negative  Patient has a history of stage 2-3 CKD. He is not taking vitamin D and does not remember being told that he has low vitamin D. A review of his chart, he has been on high-dose ergocalciferol between 2010 2012 along with low-dose vitamin D. I was not able to review vitamin D levels in Epic. No h/o fractures (in 1992, after falling of a ladder, he crushed his left heel; broke right wrist), no OP (did not have a DEXA scan), no kidney stones.   He has a complicated past medical history, with history of atherosclerotic coronary artery disease with history of NSTEMI, and also TIAs and a CVA, and cerebrovascular disease. For these reasons, he is not considered a good candidate for surgery.  I reviewed his chart including office notes, telephone notes, labs, and available  scanned records. His medical history also includes AAA, hyperlipidemia, systolic and diastolic CHF, history of lung cancer with left upper lobe resection in 2003 but with no chemoradiation therapy, erectile dysfunction with normal testosterone, low platelets, history of medication noncompliance, tobacco abuse one pack per week.  Review of Systems Constitutional: no weight gain/loss, no fatigue, no subjective hyperthermia/hypothermia, nocturia x 2, poor sleep Eyes: no blurry vision, no xerophthalmia ENT: no sore throat, no nodules palpated in throat, no dysphagia/odynophagia, no hoarseness Cardiovascular: no CP/occasional SOB/palpitations/leg swelling Respiratory: + cough/no SOB/+ wheezing Gastrointestinal: no N/V/D/C Musculoskeletal: no muscle/joint aches Skin: no rashes Neurological: no tremors/numbness/tingling/dizziness Psychiatric: no depression/anxiety Difficulty with erections Past Medical History  Diagnosis Date  . Hypertension   . Hyperlipidemia   . Thyroid disease   . Tobacco abuse   . Tobacco abuse counseling   . Myocardial infarction   . CHF (congestive heart failure)     ischemic cardiomyopathy, systolic and diastolic dysfunction  by  ECHO Mar 2011  . Lung Cancer     s/p LUL lobecotmy, remote  . 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 Cerebrovascular disease Sept 2012    diffuse, by CT angiogram   . Stroke     Past Surgical History  Procedure Laterality Date  . Back surgery      2 operations, herniated disc  . Lobectomy  LUL,   . Cosmetic surgery     History   Social History  . Marital Status: separated    Spouse Name: N/A    Number of Children: 9   Occupational History  . 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 Smoking status: Current Every Day Smoker -- 1.00 packs/day for 50 years    Types: Cigarettes  . Smokeless tobacco: Never Used     Comment: patient has resumed smoking 2-3 daily   .  Alcohol Use: 7.0 oz/week    14 drink(s) per week  . Drug Use: No  . Sexually Active: Yes -- Male partner(s)   Other Topics Concern  . 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 albuterol (PROVENTIL HFA;VENTOLIN HFA) 108 (90 BASE) MCG/ACT inhaler Inhale 2 puffs into the lungs every 6 (six) hours as needed for wheezing.  1 Inhaler  11  . aspirin 325 MG EC tablet Take 325 mg by mouth daily.        . carvedilol (COREG) 25 MG tablet TAKE ONE TABLET BY MOUTH TWICE DAILY  60 tablet  5  . clopidogrel (PLAVIX) 75 MG tablet Take 1 tablet (75 mg total) by mouth daily.  90 tablet  3  . hydrALAZINE (APRESOLINE) 50 MG tablet TAKE ONE TABLET BY MOUTH THREE TIMES DAILY  60 tablet  2  . nitroGLYCERIN (NITROSTAT) 0.4 MG SL tablet Place 0.4 mg under the tongue every 5 (five) minutes as needed.        Marland Kitchen amLODipine (NORVASC) 10 MG tablet Take 1 tablet (10 mg total) by mouth daily.  30 tablet  5  . atorvastatin (LIPITOR) 80 MG tablet Take 1 tablet (80 mg total) by mouth daily.  90 tablet  3  . budesonide-formoterol (SYMBICORT) 160-4.5 MCG/ACT inhaler Inhale 2 puffs into the lungs 2 (two) times daily.  1 Inhaler  12  . finasteride (PROSCAR) 5 MG tablet       . PROSCAR 5 MG tablet       . spironolactone (ALDACTONE) 25 MG tablet Take 1 tablet (25 mg total) by mouth daily.  30 tablet  5  . tadalafil (CIALIS) 20 MG tablet Take 0.5-1 tablets (10-20 mg total) by mouth every other day as needed for erectile dysfunction.  5 tablet  11   No current facility-administered medications on file prior to visit.   Allergies  Allergen Reactions  . Hydrochlorothiazide Swelling   Family History  Problem Relation Age of Onset  . Heart disease Mother   . Cancer Father     Lung  . Cancer Sister     Breast   Objective:   Physical Exam BP 138/90  Pulse 77  Temp(Src)  98.1 F (36.7 C) (Oral)  Resp 12  Ht 6\' 1"  (1.854 m)  Wt 171 lb (77.565 kg)  BMI 22.57 kg/m2  SpO2 96% Wt Readings from Last 3 Encounters:  11/11/12 171 lb (77.565 kg)  10/30/12 174 lb 12 oz (79.266 kg)  08/19/12 174 lb 8 oz (79.153 kg)  Constitutional: normal weight, in NAD, unsteady on his feet, walks with a cane Eyes: PERRLA, EOMI, no exophthalmos ENT: moist mucous membranes, no thyromegaly, questionable prominent left more than right thyroid gland versus nodules, no cervical lymphadenopathy Cardiovascular: RRR, No MRG Respiratory: CTA B Gastrointestinal: abdomen soft, NT, ND,  BS+ Musculoskeletal: no deformities, strength intact in all 4 Skin: moist, warm, no rashes Neurological: no tremor with outstretched hands, DTR normal in all 4    Assessment:     1. Hypercalcemia - fluctuating within 1 mg/dL above the upper limit of normal - High PTH levels - History of vitamin D deficiency, unknown vitamin D status - no previous DEXA scans    Plan:     I discussed with the patient about his history of hypercalcemia. He tells me that he has been investigated in the past at [facility] in [place], and was told that he might have a thyroid/parathyroid problem. This happened 10 years ago. I will try to get records, although chances are slim since he cannot remember the name of the doctor or the hospital. - We discussed about the fact that a high parathyroid level can be due to a parathyroid adenoma, advanced kidney failure (which he does not have), or vitamin D deficiency (which he had in the past per review of the chart, and it is unclear whether he has it now). A vitamin D deficiency usually causes low or normal calcium and blood, though. It can, however, exacerbate the signs and symptoms from primary hyperparathyroidism. - we'll start by checking a 25-hydroxy vitamin D and 1, 25 hydroxy vitamin D levels, phosphorus, TSH, free t4, and a spot urinary calcium and creatinine.  - If  the vitamin D is normal, I will schedule a DEXA scan for him (to include 1/3rd distal radius), and most likely will start bisphosphonates, which would help with both hypercalcemia and low bone mineral density - if the vitamin D level is low, I will start him on replacement (most likely with 2000 units cholecalciferol daily to avoid hypercalcemia), and I will see him back in 2 months to recheck a vitamin D level and decide for further plan - Patient agrees to plan and I will call him with the results of his labs.   Office Visit on 11/11/2012  Component Date Value Range Status  . Vit D, 25-Hydroxy 11/11/2012 10* 30 - 89 ng/mL Final   Comment: This assay accurately quantifies Vitamin D, which is the sum of the                          25-Hydroxy forms of Vitamin D2 and D3.  Studies have shown that the                          optimum concentration of 25-Hydroxy Vitamin D is 30 ng/mL or higher.                           Concentrations of Vitamin D between 20 and 29 ng/mL are considered to                          be insufficient and concentrations less than 20 ng/mL are considered                          to be deficient for Vitamin D.  . Calcium, Ur 11/11/2012 9   Final  . Creatinine, Urine 11/11/2012 206.0   Final  . TSH 11/11/2012 2.10  0.35 - 5.50 uIU/mL Final  . Free T4 11/11/2012 1.07  0.60 - 1.60 ng/dL Final  . Phosphorus 16/05/9603 2.9  2.3 - 4.6 mg/dL Final  . Vitamin D 1, 25 (OH) Total 11/11/2012 55  18 - 72 pg/mL Final  . Vitamin D3 1, 25 (OH) 11/11/2012 55   Final  . Vitamin D2 1, 25 (OH) 11/11/2012 <8   Final   Comment: Vitamin D3, 1,25(OH)2 indicates both endogenous                          production and supplementation.  Vitamin D2, 1,25(OH)2                          is an indicator of exogeous sources, such as diet or                          supplementation.  Interpretation and therapy are based                          on measurement of Vitamin D,1,25(OH)2, Total.                           This test was developed and its performance                          characteristics have been determined by [facility], [place], [place].                          Performance characteristics refer to the                          analytical performance of the test.   Patient's 25-hydroxy vitamin D is very low, and could explain at least in part his high PTH level (secondary hyperparathyroidism) despite normal 1,25 hydroxy vitamin D level. However, primary hyperparathyroidism can still coexist, but cannot make a diagnosis until after replacement with vitamin D. His phosphorus level is normal. TFTs normal.  Urinary calcium is very low, as expected for a low vitamin D level. Calculated Ca/Cr ratio = 0.0054, but this calculation has been based on spot urine values, so not very accurate. I intend to check a proper 24 h urine collection once his vit D is repleted.  Will start on 2000 IU vit D daily and recheck in 8 weeks. If not at goal, will start higher dose. Patient called and informed, letter sent. She will come in 2 months to have labs drawn either in my office or in Dr. Melina Schools office.

## 2012-11-11 NOTE — Patient Instructions (Addendum)
I Will call with the results of your labs and we will decide for further plan at that time. Please return in 2 months for reevaluation. Please call me with any questions. My telephone number is  [phone].

## 2012-11-12 LAB — CREATININE, URINE, RANDOM: Creatinine, Urine: 206 mg/dL

## 2012-11-12 LAB — CALCIUM, URINE, RANDOM: Calcium, Ur: 9 mg/dL

## 2012-11-15 LAB — VITAMIN D 1,25 DIHYDROXY
Vitamin D 1, 25 (OH)2 Total: 55 pg/mL (ref 18–72)
Vitamin D2 1, 25 (OH)2: <8 pg/mL

## 2012-11-18 ENCOUNTER — Encounter: Payer: Self-pay | Admitting: Internal Medicine

## 2012-12-25 ENCOUNTER — Encounter: Payer: Self-pay | Admitting: Internal Medicine

## 2013-01-13 ENCOUNTER — Encounter: Payer: Self-pay | Admitting: Internal Medicine

## 2013-01-13 ENCOUNTER — Ambulatory Visit (INDEPENDENT_AMBULATORY_CARE_PROVIDER_SITE_OTHER): Payer: Medicare Other | Admitting: Internal Medicine

## 2013-01-13 VITALS — BP 138/88 | HR 62 | Temp 98.1°F | Resp 12 | Ht 73.0 in | Wt 165.0 lb

## 2013-01-13 DIAGNOSIS — E210 Primary hyperparathyroidism: Secondary | ICD-10-CM

## 2013-01-13 NOTE — Progress Notes (Signed)
Subjective:     Patient ID: Martin Haney, male   DOB: 02-15-1946, 67 y.o.   MRN: [id number]  HPI Martin Haney is a pleasant 67 year old man, returning for f/u of hypercalcemia in the context of primary hyperparathyroidism.   He was told in 2004 that he might have a thyroid/parthyroid (?) problem, by Dr. in [place]. He started to see Dr. Darrick Huntsman in ~2011 after he had a light heart attack. He has been followed for his hypercalcemia, which recent tests showing an increased level of parathyroid hormone, with fluctuating levels of calcium, between 10.2-11.4:  Lab Results  Component Value Date   PTH 179.0* 10/30/2012   Lab Results  Component Value Date   CALCIUM 10.7* 10/30/2012   CALCIUM 10.5 10/30/2012   CALCIUM 11.0* 07/31/2012   CALCIUM 11.4* 04/24/2012   CALCIUM 10.2 06/26/2011   CALCIUM 11.2* 06/19/2011   Last set of labs, from 10/30/2012 shows a calcium of 10.5 and 10.7, PTH 179, ionized calcium of 1.56 (1.12-1.32), and a normal BMP, with an improved GFR, of 72, and  BUN/creatinine of 12/1.3. A PTHrp was previously tested and it was negative.  Per review of his chart, he has been on high-dose ergocalciferol between 2010-2012 along with low-dose vitamin D. A 25 HO vitamin D was 10 at last visit, and a 1,25 diHO vitamin D was 55. He was not taking any vit D then, but we started 2000 IU daily. At last visit, we also checked Urinary calcium, which very low, at 9, as expected for a low vitamin D level. Calculated Ca/Cr ratio = 0.0054, but this calculation has been based on spot urine values, so not very accurate. I intended to check a proper 24 h urine collection once his vit D is repleted. He is here for a recheck on his vitamin D level.  Patient has a history of stage 2-3 CKD. Has a h/o fractures (traumatic: in 1992, after falling of a ladder, he crushed his left heel; broke right wrist), no OP (did not have a DEXA scan), no kidney stones.   He has a complicated past medical history, with  history of atherosclerotic coronary artery disease with history of NSTEMI, and also TIAs and a CVA, and cerebrovascular disease. For these reasons, he is not considered a good candidate for parathyroid surgery. His medical history also includes AAA, hyperlipidemia, systolic and diastolic CHF, history of lung cancer with left upper lobe resection in 2003 but with no chemoradiation therapy, erectile dysfunction with normal testosterone, low platelets, history of medication noncompliance, tobacco abuse one pack per week.  Review of Systems Constitutional: no weight loss, no fatigue, no subjective hyperthermia/hypothermia, nocturia x 1 Eyes: no blurry vision, no xerophthalmia ENT: no sore throat, no nodules palpated in throat, no dysphagia/odynophagia, no hoarseness Cardiovascular: no CP/exertional, mild SOB/palpitations/leg swelling Respiratory: no cough/no SOB/ no wheezing Gastrointestinal: no N/V/D/C Musculoskeletal: no muscle/joint aches Skin: no rashes Neurological: no tremors/numbness/tingling/dizziness Psychiatric: no depression/anxiety  I reviewed pt's medications, allergies, PMH, social hx, family hx and no changes required, except as mentioned above.  Objective:   Physical Exam BP 138/88  Pulse 62  Temp(Src) 98.1 F (36.7 C) (Oral)  Resp 12  Ht 6\' 1"  (1.854 m)  Wt 165 lb (74.844 kg)  BMI 21.77 kg/m2  SpO2 96% Wt Readings from Last 3 Encounters:  01/13/13 165 lb (74.844 kg)  11/11/12 171 lb (77.565 kg)  10/30/12 174 lb 12 oz (79.266 kg)  Constitutional: normal weight, in NAD, unsteady on his feet, walks with  a cane Eyes: PERRLA, EOMI, no exophthalmos ENT: moist mucous membranes, no thyromegaly, questionable prominent left more than right thyroid gland versus nodules, no cervical lymphadenopathy Cardiovascular: RRR, + SEM, no RG Respiratory: CTA B Gastrointestinal: abdomen soft, NT, ND, BS+ Musculoskeletal: no deformities, strength intact in all 4 Skin: moist, warm, no  rashes Neurological: no tremor with outstretched hands, DTR normal in all 4    Assessment:     1. Hypercalcemia  - calcium fluctuating within 1 mg/dL above the upper limit of normal - 10/30/2012: PTH 179  calcium 10.5 and 10.7 - 11/11/2012: vitamin D 10 (has h/o vitamin D deficiency)  normal phos 2.9 (2.3-4.6)  normal TFTs  spot urine Ca 9 (FECa 0.054) - no previous DEXA scans  2. Low vitamin D - 11/11/2012: vitamin D 10  Plan:     1 and 2.  - at last visit, pt's vit D was very low, at 10, and, consequently, his spot urinary calcium was very low, too. The low 25-hydroxy vitamin D is could explain at least in part his high PTH level (secondary hyperparathyroidism) despite normal 1,25 hydroxy vitamin D level. However, primary hyperparathyroidism can still coexist.  - I will recheck a vitamin D level after 2 mo of supplementation and will continue the 2000 IU vit D daily for now and will change the dose based on the results. Pt appears to be tolerating his vit D dose well, but I am worried about his weight loss of 6 lbs in last 2 mo... - If the vitamin D is normal, I will schedule a DEXA scan for him (to include 1/3rd distal radius), and most likely will start bisphosphonates, which would help with both hypercalcemia and low bone mineral density.  Office Visit on 01/13/2013  Component Date Value Range Status  . Vit D, 25-Hydroxy 01/13/2013 35  30 - 89 ng/mL Final   Comment: This assay accurately quantifies Vitamin D, which is the sum of the                          25-Hydroxy forms of Vitamin D2 and D3.  Studies have shown that the                          optimum concentration of 25-Hydroxy Vitamin D is 30 ng/mL or higher.                           Concentrations of Vitamin D between 20 and 29 ng/mL are considered to                          be insufficient and concentrations less than 20 ng/mL are considered                          to be deficient for Vitamin D.  . PTH 01/13/2013  122.3* 14.0 - 72.0 pg/mL Final  . Calcium 01/13/2013 11.2* 8.4 - 10.5 mg/dL Final   Vitamin D now normal, but Calcium slightly higher (baseline 10.5-11.4 in last 2 years). I will call him to start 1000 IU vit D daily. Will order DEXA.

## 2013-01-13 NOTE — Patient Instructions (Addendum)
Please return in 4 months. For now, continue the 2000 units of vitamin D.

## 2013-01-14 LAB — VITAMIN D 25 HYDROXY (VIT D DEFICIENCY, FRACTURES): Vit D, 25-Hydroxy: 35 ng/mL (ref 30–89)

## 2013-01-15 ENCOUNTER — Telehealth: Payer: Self-pay | Admitting: *Deleted

## 2013-01-15 ENCOUNTER — Telehealth: Payer: Self-pay | Admitting: Internal Medicine

## 2013-01-15 MED ORDER — CARVEDILOL 25 MG PO TABS: ORAL_TABLET | ORAL | Status: DC

## 2013-01-15 NOTE — Telephone Encounter (Signed)
Called Martin Haney and told him that his vit D is now good, but his calcium level is slightly high. That Dr Elvera Lennox needs him to decrease the vitamin D to 1000 units a day (1 capsule). Also, Dr Elvera Lennox has ordered a DEXA scan for him to have done at the [place] office. Martin Haney does not know where that is. Martin Haney states he lives in [place] and would like to have it done down there, if possible. Please advise.

## 2013-01-15 NOTE — Telephone Encounter (Signed)
Script sent to pharmacy.

## 2013-01-15 NOTE — Telephone Encounter (Signed)
This is OK, if we can also check the radius DEXA there.

## 2013-01-15 NOTE — Telephone Encounter (Signed)
Pt called needs rx carvetilol 25mg  1 tabelt 2 times daily  [facility] [address]  Pt is completely out of meds

## 2013-01-20 ENCOUNTER — Encounter: Payer: Self-pay | Admitting: *Deleted

## 2013-01-27 ENCOUNTER — Telehealth: Payer: Self-pay | Admitting: *Deleted

## 2013-01-27 NOTE — Telephone Encounter (Signed)
Pt called to clarify DEXA scan appt scheduled for June 17th. Clarified appt.

## 2013-02-03 ENCOUNTER — Ambulatory Visit: Payer: Medicare Other | Admitting: Internal Medicine

## 2013-02-06 ENCOUNTER — Encounter: Payer: Self-pay | Admitting: Internal Medicine

## 2013-02-06 ENCOUNTER — Ambulatory Visit (INDEPENDENT_AMBULATORY_CARE_PROVIDER_SITE_OTHER): Payer: Medicare Other | Admitting: Internal Medicine

## 2013-02-06 VITALS — BP 136/78 | HR 71 | Temp 98.1°F | Resp 16 | Wt 166.2 lb

## 2013-02-06 DIAGNOSIS — F17200 Nicotine dependence, unspecified, uncomplicated: Secondary | ICD-10-CM

## 2013-02-06 DIAGNOSIS — Z716 Tobacco abuse counseling: Secondary | ICD-10-CM

## 2013-02-06 DIAGNOSIS — R6881 Early satiety: Secondary | ICD-10-CM

## 2013-02-06 DIAGNOSIS — I10 Essential (primary) hypertension: Secondary | ICD-10-CM

## 2013-02-06 DIAGNOSIS — Z7189 Other specified counseling: Secondary | ICD-10-CM

## 2013-02-06 NOTE — Assessment & Plan Note (Addendum)
Now with early satiety x 3 months, weight loss of 15 lbs over the past year but not recently. 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  Managed by Dr. Lafe Garin.

## 2013-02-06 NOTE — Assessment & Plan Note (Addendum)
With 15 lb wt loss noted over the last year but none in the last 3 months.  He has history of etoh abuse, remote,  And ongoing tobacco abuse ,  As well as history of lung Ca.  Concerned about GI cancer. Hi risk for decompensation for an endoscopy  Is significant  will start with UGI small bowel FT  And fecal occult blood testing

## 2013-02-06 NOTE — Progress Notes (Signed)
Patient ID: Dixie Jafri, male   DOB: 06-24-1946, 67 y.o.   MRN: [id number]   Patient Active Problem List   Diagnosis Date Noted  . Early satiety 02/06/2013  . Hypercalcemia 08/01/2012  . Erectile dysfunction 08/01/2012  . CVA (cerebral vascular accident) 08/05/2011  . Tobacco abuse   . Tobacco abuse counseling   . CHF (congestive heart failure)   . Lung Cancer   . Chronic kidney disease   . TIA (transient ischemic attack) 05/23/2011  . Cerebrovascular disease 05/23/2011  . Hypertension 05/23/2011  . Arteriosclerotic coronary artery disease 05/23/2011    Subjective:  CC:   Chief Complaint  Patient presents with  . Follow-up    6 month C/O no appetite    HPI:   Major Tinninis a 67 y.o. male who presents Follow up on COPD with ongoing tobacco abuse  Hypertension, PAD with multiple CVA, and new onset hypercalcemia with subsequent endocrinology referral.  He has resumed smoking, averaging a pack every 3 days despite multiple counselling sessions.  His girlfriend Eunice Blase  Is smoking, which is making it difficult for him to abstain.   His cc is poor appetite.  Gets hungry but having early satiety.  Symptoms has been present for 3 months. He is eating small amounts several times per day.  Moves his bowels every 2 or 3 days, denies constipation or change in bowel habits.  NO abdominal pain, night sweats.     Past Medical History  Diagnosis Date  . Hypertension   . Hyperlipidemia   . Thyroid disease   . Tobacco abuse   . Tobacco abuse counseling   . Myocardial infarction   . CHF (congestive heart failure)     ischemic cardiomyopathy, systolic and diastolic dysfunction  by  ECHO Mar 2011  . Lung Cancer     s/p LUL lobecotmy, remote  . 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 Cerebrovascular disease Sept 2012    diffuse, by CT angiogram   . Stroke     Past Surgical History  Procedure Laterality Date  . Back surgery      2  operations, herniated disc  . Lobectomy      LUL,   . Cosmetic surgery         The following portions of the patient's history were reviewed and updated as appropriate: Allergies, current medications, and problem list.    Review of Systems:   12 Pt  review of systems was negative except those addressed in the HPI,     History   Social History  . Marital Status: Single    Spouse Name: N/A    Number of Children: N/A  . 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 Smoking status: Current Every Day Smoker -- 1.00 packs/day for 50 years    Types: Cigarettes  . Smokeless tobacco: Never Used     Comment: patient has resumed smoking 2-3 daily   . Alcohol Use: 7.0 oz/week    14 drink(s) per week  . Drug Use: No  . Sexually Active: Yes -- Male partner(s)   Other Topics Concern  . 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 ROM normal. No CVA tenderness. Lungs: clear to auscultation bilaterally Heart: regular rate and rhythm, S1, S2 normal, no murmur, click, rub or gallop Abdomen: soft, non-tender; bowel sounds normal; no masses,  no organomegaly Pulses: 2+ and symmetric Skin: Skin color, texture, turgor normal. No rashes or lesions Lymph nodes: Cervical, supraclavicular, and axillary nodes normal.  Assessment and Plan:  Early satiety With 15 lb wt loss noted over  the last year but none in the last 3 months.  He has history of etoh abuse, remote,  And ongoing tobacco abuse ,  As well as history of lung Ca.  Concerned about GI cancer. Hi risk for decompensation for an endoscopy  Is significant  will start with UGI small bowel FT  And fecal occult blood testing   Hypercalcemia Now with early satiety x 3 months, weight loss of 15 lbs over the past year but not recently. 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  Managed by Dr. Lafe Garin.   Hypertension Adequate control given his severe PAD and hypoperfusion risk with tighter control  No changes today   Tobacco abuse counseling  Smoking cessation instruction/counseling given:  counseled patient on the dangers of tobacco use, advised patient to stop smoking, and reviewed strategies to maximize success   Updated Medication List Outpatient Encounter Prescriptions as of 02/06/2013  Medication Sig Dispense Refill  . amLODipine (NORVASC) 10 MG tablet Take 1 tablet (10 mg total) by mouth daily.  30 tablet  5  . aspirin 325 MG EC tablet Take 325 mg by mouth daily.        Marland Kitchen atorvastatin (LIPITOR) 80 MG tablet Take 1 tablet (80 mg total) by mouth daily.  90 tablet  3  . carvedilol (COREG) 25 MG tablet TAKE ONE TABLET BY MOUTH TWICE DAILY  60 tablet  5  . clopidogrel (PLAVIX) 75 MG tablet Take 1 tablet (75 mg total) by mouth daily.  90 tablet  3  . nitroGLYCERIN (NITROSTAT) 0.4 MG SL tablet Place 0.4 mg under the tongue every 5 (five) minutes as needed.        . rosuvastatin (CRESTOR) 20 MG tablet Take 20 mg by mouth daily.      Marland Kitchen albuterol (PROVENTIL HFA;VENTOLIN HFA) 108 (90 BASE) MCG/ACT inhaler Inhale 2 puffs into the lungs every 6 (six) hours as needed for wheezing.  1 Inhaler  11  . budesonide-formoterol (SYMBICORT) 160-4.5 MCG/ACT inhaler Inhale 2 puffs into the lungs 2 (two) times daily.  1 Inhaler  12  . finasteride (PROSCAR) 5 MG tablet       . hydrALAZINE (APRESOLINE) 50 MG tablet  TAKE ONE TABLET BY MOUTH THREE TIMES DAILY  60 tablet  2  . PROSCAR 5 MG tablet       . spironolactone (ALDACTONE) 25 MG tablet Take 1 tablet (25 mg total) by mouth daily.  30 tablet  5  . tadalafil (CIALIS) 20 MG tablet Take 0.5-1 tablets (10-20 mg total) by mouth every other day as needed for erectile dysfunction.  5 tablet  11   No facility-administered encounter medications on file as of 02/06/2013.     Orders Placed This  Encounter  Procedures  . DG UGI W/Small Bowel High Density    No Follow-up on file.

## 2013-02-08 ENCOUNTER — Encounter: Payer: Self-pay | Admitting: Internal Medicine

## 2013-02-08 NOTE — Assessment & Plan Note (Signed)
Adequate control given his severe PAD and hypoperfusion risk with tighter control  No changes today

## 2013-02-08 NOTE — Assessment & Plan Note (Signed)
Smoking cessation instruction/counseling given:  counseled patient on the dangers of tobacco use, advised patient to stop smoking, and reviewed strategies to maximize success 

## 2013-02-11 ENCOUNTER — Other Ambulatory Visit: Payer: Self-pay | Admitting: Emergency Medicine

## 2013-02-19 ENCOUNTER — Encounter: Payer: Self-pay | Admitting: Internal Medicine

## 2013-02-19 DIAGNOSIS — R6881 Early satiety: Secondary | ICD-10-CM

## 2013-02-19 NOTE — Assessment & Plan Note (Signed)
Patient admitted with N/v gastric outlet obstruction secondary to stricture by EGD (Oh,  02/12/13) which was unable to be traversed with scope

## 2013-02-27 ENCOUNTER — Encounter: Payer: Self-pay | Admitting: Internal Medicine

## 2013-03-12 ENCOUNTER — Encounter (HOSPITAL_COMMUNITY): Payer: Self-pay | Admitting: Pharmacy Technician

## 2013-03-12 ENCOUNTER — Encounter (HOSPITAL_COMMUNITY): Payer: Self-pay | Admitting: *Deleted

## 2013-03-14 ENCOUNTER — Encounter (HOSPITAL_COMMUNITY): Payer: Self-pay | Admitting: Emergency Medicine

## 2013-03-14 ENCOUNTER — Emergency Department (HOSPITAL_COMMUNITY)
Admission: EM | Admit: 2013-03-14 | Discharge: 2013-03-14 | Disposition: A | Discharge status: Home / Self Care | Payer: Medicare Other | Attending: Emergency Medicine | Admitting: Emergency Medicine

## 2013-03-14 DIAGNOSIS — N182 Chronic kidney disease, stage 2 (mild): Secondary | ICD-10-CM | POA: Insufficient documentation

## 2013-03-14 DIAGNOSIS — Z862 Personal history of diseases of the blood and blood-forming organs and certain disorders involving the immune mechanism: Secondary | ICD-10-CM | POA: Insufficient documentation

## 2013-03-14 DIAGNOSIS — Z8639 Personal history of other endocrine, nutritional and metabolic disease: Secondary | ICD-10-CM | POA: Insufficient documentation

## 2013-03-14 DIAGNOSIS — I509 Heart failure, unspecified: Secondary | ICD-10-CM | POA: Insufficient documentation

## 2013-03-14 DIAGNOSIS — Z79899 Other long term (current) drug therapy: Secondary | ICD-10-CM | POA: Insufficient documentation

## 2013-03-14 DIAGNOSIS — Z8673 Personal history of transient ischemic attack (TIA), and cerebral infarction without residual deficits: Secondary | ICD-10-CM | POA: Insufficient documentation

## 2013-03-14 DIAGNOSIS — F17200 Nicotine dependence, unspecified, uncomplicated: Secondary | ICD-10-CM | POA: Insufficient documentation

## 2013-03-14 DIAGNOSIS — Z7982 Long term (current) use of aspirin: Secondary | ICD-10-CM | POA: Insufficient documentation

## 2013-03-14 DIAGNOSIS — I252 Old myocardial infarction: Secondary | ICD-10-CM | POA: Insufficient documentation

## 2013-03-14 DIAGNOSIS — I129 Hypertensive chronic kidney disease with stage 1 through stage 4 chronic kidney disease, or unspecified chronic kidney disease: Secondary | ICD-10-CM | POA: Insufficient documentation

## 2013-03-14 DIAGNOSIS — Z85118 Personal history of other malignant neoplasm of bronchus and lung: Secondary | ICD-10-CM | POA: Insufficient documentation

## 2013-03-14 DIAGNOSIS — K311 Adult hypertrophic pyloric stenosis: Secondary | ICD-10-CM | POA: Insufficient documentation

## 2013-03-14 LAB — CBC
HCT: 40.5 % (ref 39.0–52.0)
MCH: 27.8 pg (ref 26.0–34.0)
MCV: 83.3 fL (ref 78.0–100.0)
Platelets: 145 10*3/uL — ABNORMAL LOW (ref 150–400)
RDW: 14 % (ref 11.5–15.5)

## 2013-03-14 LAB — BASIC METABOLIC PANEL
BUN: 17 mg/dL (ref 6–23)
CO2: 29 mEq/L (ref 19–32)
Calcium: 11.2 mg/dL — ABNORMAL HIGH (ref 8.4–10.5)
Chloride: 103 mEq/L (ref 96–112)
Creatinine, Ser: 1.6 mg/dL — ABNORMAL HIGH (ref 0.50–1.35)
Glucose, Bld: 103 mg/dL — ABNORMAL HIGH (ref 70–99)

## 2013-03-14 NOTE — ED Notes (Signed)
MD made aware of pt's high BP.

## 2013-03-14 NOTE — ED Provider Notes (Signed)
CSN: [id number]     Arrival date & time 03/14/13  1825 History     First MD Initiated Contact with Patient 03/14/13 1835     Chief Complaint  Patient presents with  . high calcium    (Consider location/radiation/quality/duration/timing/severity/associated sxs/prior Treatment) HPI Comments: Patient with known chronic hypercalcemia presents to ED, sent in by Dr Dulce Sellar, [facility], who recommended he come to the ED to be admitted for hypercalcemia.  Pt has a gastric outlet obstruction caused by a stricture and is supposed to have stent placed on 03/18/13.  Per patient, Dr Dulce Sellar told them his calcium is too high to do the procedure and it needs to be brought down before he can do this.  Pt reports he has been on a clear liquid diet for one month.  Denies any new or concerning symptoms.  Denies fever, chills, body aches, CP, SOB, cough, abdominal pain, N/V.  Pt is not having bowel movements but is able to pass flatus.  Notes he did have one episode of N/V after he was told he could try eating bread.    The history is provided by the patient and the spouse.    Past Medical History  Diagnosis Date  . Hypertension   . Hyperlipidemia   . Thyroid disease   . Tobacco abuse   . Tobacco abuse counseling   . CHF (congestive heart failure)     ischemic cardiomyopathy, systolic and diastolic dysfunction  by  ECHO Mar 2011  . Cerebrovascular disease Sept 2012    diffuse, by CT angiogram   . Myocardial infarction yrs ago     1 or 2 mi's  . Stroke 2012 or 2013  . 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 Lung Cancer yrs ago    s/p LUL lobecotmy, remote   Past Surgical History  Procedure Laterality Date  . Lobectomy  yrs ago    LUL,   . Back surgery  2003    2 operations, herniated disc  . Ankle fracture surgery Left yrs ago   Family History  Problem Relation Age of Onset  . Heart disease Mother   . Cancer Father     Lung  . Cancer Sister     Breast    History  Substance Use Topics  . Smoking status: Current Every Day Smoker -- 1.00 packs/day for 50 years    Types: Cigarettes  . Smokeless tobacco: Never Used     Comment: patient has resumed smoking 2-3 daily   . Alcohol Use: No    Review of Systems  Constitutional: Negative for fever.  Respiratory: Negative for cough and shortness of breath.   Cardiovascular: Negative for chest pain.  Gastrointestinal: Negative for nausea, vomiting, abdominal pain and diarrhea.    Allergies  Hydrochlorothiazide and Amlodipine  Home Medications   Current Outpatient Rx  Name  Route  Sig  Dispense  Refill  . aspirin 325 MG EC tablet   Oral   Take 325 mg by mouth daily.           . budesonide-formoterol (SYMBICORT) 160-4.5 MCG/ACT inhaler   Inhalation   Inhale 2 puffs into the lungs 2 (two) times daily as needed (for shortness of breath).         . carvedilol (COREG) 25 MG tablet   Oral   Take 25 mg by mouth 2 (two) times daily with a meal.         .  cholecalciferol (VITAMIN D) 1000 UNITS tablet   Oral   Take 1,000 Units by mouth daily.         . clomiPRAMINE (ANAFRANIL) 75 MG capsule   Oral   Take 75 mg by mouth at bedtime.         . hydrALAZINE (APRESOLINE) 50 MG tablet   Oral   Take 50 mg by mouth 3 (three) times daily.         Marland Kitchen lisinopril (PRINIVIL,ZESTRIL) 40 MG tablet   Oral   Take 40 mg by mouth daily.         . pantoprazole (PROTONIX) 40 MG tablet   Oral   Take 40 mg by mouth 2 (two) times daily.         . nitroGLYCERIN (NITROSTAT) 0.4 MG SL tablet   Sublingual   Place 0.4 mg under the tongue every 5 (five) minutes as needed for chest pain.           BP 208/96  Pulse 57  Temp(Src) 98.8 F (37.1 C) (Oral)  Resp 18  SpO2 99% Physical Exam  Nursing note and vitals reviewed. Constitutional: He appears well-developed and well-nourished. No distress.  HENT:  Head: Normocephalic and atraumatic.  Neck: Neck supple.  Cardiovascular: Normal  rate and regular rhythm.   Pulmonary/Chest: Effort normal and breath sounds normal. No respiratory distress. He has no wheezes. He has no rales.  Abdominal: Soft. He exhibits no distension. There is no tenderness. There is no rebound and no guarding.  Neurological: He is alert. He exhibits normal muscle tone.  Skin: He is not diaphoretic.    ED Course   Procedures (including critical care time)  Labs Reviewed  BASIC METABOLIC PANEL - Abnormal; Notable for the following:    Glucose, Bld 103 (*)    Creatinine, Ser 1.60 (*)    Calcium 11.2 (*)    GFR calc non Af Amer 44 (*)    GFR calc Af Amer 51 (*)    All other components within normal limits  CBC - Abnormal; Notable for the following:    Platelets 145 (*)    All other components within normal limits   No results found.  9:10 PM I spoke with Dr Randa Evens about the patient and his calcium level.  Dr Randa Evens agrees with discharge home and to tell pt to plan on having the procedure as previously planned.  He will leave a note for Dr Dulce Sellar regarding this.   Filed Vitals:   03/14/13 2059  BP: 202/108  Pulse: 62  Temp:   Resp: 16     1. Gastric outlet obstruction   2. Hypercalcemia   3. H/O hypercalcemia     MDM  Pt with gastric outlet obstruction due to stricture presents by recommendation of his GI doctor (Dr Hinda Lenis) who sent him to be admitted to bring his calcium level down prior to procedure being done 03/18/13.  Calcium level is only mildly elevated here.  I spoke with Dr Randa Evens- please see above.  I spoke with patient and family regarding the results and plan for discharge.  Discussed all results with patient.  Pt given return precautions.  Pt verbalizes understanding and agrees with plan.    Patient is hypertensive- no CP, no SOB, no AMS.  No HA.  He simple has not yet taken his home medications for HTN today.  Doubt hypertensive urgency or emergency.    Trixie Dredge, PA-C 03/14/13 2124

## 2013-03-14 NOTE — ED Notes (Signed)
Pt to have stent placed in stomach on Tuesday Aug. 5. Pt saw doctor today and had blood work taken and was told calcium was too high for procedure. Doctor told him he needed to be admitted here until calcium level normal.

## 2013-03-15 NOTE — ED Provider Notes (Signed)
Medical screening examination/treatment/procedure(s) were performed by non-physician practitioner and as supervising physician I was immediately available for consultation/collaboration.   Gwyneth Sprout, MD 03/15/13 [phone]

## 2013-03-17 ENCOUNTER — Other Ambulatory Visit: Payer: Self-pay | Admitting: Gastroenterology

## 2013-03-18 ENCOUNTER — Ambulatory Visit (HOSPITAL_COMMUNITY): Payer: Medicare Other | Admitting: Anesthesiology

## 2013-03-18 ENCOUNTER — Encounter (HOSPITAL_COMMUNITY): Payer: Self-pay | Admitting: Anesthesiology

## 2013-03-18 ENCOUNTER — Ambulatory Visit (HOSPITAL_COMMUNITY): Payer: Medicare Other

## 2013-03-18 ENCOUNTER — Encounter (HOSPITAL_COMMUNITY)
Admission: RE | Disposition: A | Discharge status: Home / Self Care | Payer: Self-pay | Source: Ambulatory Visit | Attending: Gastroenterology

## 2013-03-18 ENCOUNTER — Ambulatory Visit (HOSPITAL_COMMUNITY)
Admission: RE | Admit: 2013-03-18 | Discharge: 2013-03-18 | Disposition: A | Discharge status: Home / Self Care | Payer: Medicare Other | Source: Ambulatory Visit | Attending: Gastroenterology | Admitting: Gastroenterology

## 2013-03-18 DIAGNOSIS — C259 Malignant neoplasm of pancreas, unspecified: Secondary | ICD-10-CM | POA: Insufficient documentation

## 2013-03-18 DIAGNOSIS — K315 Obstruction of duodenum: Secondary | ICD-10-CM | POA: Insufficient documentation

## 2013-03-18 DIAGNOSIS — R978 Other abnormal tumor markers: Secondary | ICD-10-CM | POA: Insufficient documentation

## 2013-03-18 HISTORY — DX: Shortness of breath: R06.02

## 2013-03-18 HISTORY — PX: DUODENAL STENT PLACEMENT: SHX5541

## 2013-03-18 HISTORY — DX: Gastro-esophageal reflux disease without esophagitis: K21.9

## 2013-03-18 HISTORY — PX: EUS: SHX5427

## 2013-03-18 HISTORY — PX: FINE NEEDLE ASPIRATION: SHX5430

## 2013-03-18 SURGERY — ESOPHAGEAL ENDOSCOPIC ULTRASOUND (EUS) RADIAL
Anesthesia: Monitor Anesthesia Care

## 2013-03-18 MED ORDER — SODIUM CHLORIDE 0.9 % IV SOLN
INTRAVENOUS | Status: DC | PRN
  Administered 2013-03-18: 13:00:00

## 2013-03-18 MED ORDER — LACTATED RINGERS IV SOLN
INTRAVENOUS | Status: DC | PRN
  Administered 2013-03-18: 09:00:00 via INTRAVENOUS

## 2013-03-18 MED ORDER — SODIUM CHLORIDE 0.9 % IV SOLN: INTRAVENOUS | Status: DC

## 2013-03-18 MED ORDER — GLYCOPYRROLATE 0.2 MG/ML IJ SOLN
INTRAMUSCULAR | Status: DC | PRN
  Administered 2013-03-18: .8 mg via INTRAVENOUS

## 2013-03-18 MED ORDER — ROCURONIUM BROMIDE 100 MG/10ML IV SOLN
INTRAVENOUS | Status: DC | PRN
  Administered 2013-03-18: 10 mg via INTRAVENOUS
  Administered 2013-03-18: 40 mg via INTRAVENOUS

## 2013-03-18 MED ORDER — ONDANSETRON HCL 4 MG/2ML IJ SOLN
INTRAMUSCULAR | Status: DC | PRN
  Administered 2013-03-18: 4 mg via INTRAVENOUS

## 2013-03-18 MED ORDER — LACTATED RINGERS IV SOLN: INTRAVENOUS | Status: DC

## 2013-03-18 MED ORDER — NEOSTIGMINE METHYLSULFATE 1 MG/ML IJ SOLN
INTRAMUSCULAR | Status: DC | PRN
Start: 2013-03-18 — End: 2013-03-18
  Administered 2013-03-18: 4 mg via INTRAVENOUS

## 2013-03-18 MED ORDER — FENTANYL CITRATE 0.05 MG/ML IJ SOLN
INTRAMUSCULAR | Status: DC | PRN
  Administered 2013-03-18 (×5): 50 ug via INTRAVENOUS

## 2013-03-18 MED ORDER — PROPOFOL 10 MG/ML IV BOLUS
INTRAVENOUS | Status: DC | PRN
  Administered 2013-03-18: 150 mg via INTRAVENOUS

## 2013-03-18 MED ORDER — SUCCINYLCHOLINE CHLORIDE 20 MG/ML IJ SOLN
INTRAMUSCULAR | Status: DC | PRN
  Administered 2013-03-18: 100 mg via INTRAVENOUS

## 2013-03-18 MED ORDER — MIDAZOLAM HCL 5 MG/5ML IJ SOLN
INTRAMUSCULAR | Status: DC | PRN
  Administered 2013-03-18: 2 mg via INTRAVENOUS

## 2013-03-18 NOTE — Anesthesia Postprocedure Evaluation (Signed)
  Anesthesia Post-op Note  Patient: Martin Haney  Procedure(s) Performed: Procedure(s) (LRB): ESOPHAGEAL ENDOSCOPIC ULTRASOUND (EUS) RADIAL (N/A) FINE NEEDLE ASPIRATION (FNA) LINEAR (N/A) DUODENAL STENT PLACEMENT (N/A)  Patient Location: PACU  Anesthesia Type: General  Level of Consciousness: awake and alert   Airway and Oxygen Therapy: Patient Spontanous Breathing  Post-op Pain: mild  Post-op Assessment: Post-op Vital signs reviewed, Patient's Cardiovascular Status Stable, Respiratory Function Stable, Patent Airway and No signs of Nausea or vomiting  Last Vitals:  Filed Vitals:   03/18/13 1430  BP: 122/79  Temp:   Resp: 13    Post-op Vital Signs: stable   Complications: No apparent anesthesia complications

## 2013-03-18 NOTE — Transfer of Care (Signed)
Immediate Anesthesia Transfer of Care Note  Patient: Martin Haney  Procedure(s) Performed: Procedure(s): ESOPHAGEAL ENDOSCOPIC ULTRASOUND (EUS) RADIAL (N/A) FINE NEEDLE ASPIRATION (FNA) LINEAR (N/A) DUODENAL STENT PLACEMENT (N/A)  Patient Location: PACU  Anesthesia Type:General  Level of Consciousness: alert   Airway & Oxygen Therapy: Patient Spontanous Breathing and Patient connected to face mask oxygen  Post-op Assessment: Report given to PACU RN  Post vital signs: Reviewed and stable  Complications: No apparent anesthesia complications

## 2013-03-18 NOTE — Anesthesia Preprocedure Evaluation (Addendum)
Anesthesia Evaluation  Patient identified by MRN, date of birth, ID band Patient awake    Reviewed: Allergy & Precautions, H&P , NPO status , Patient's Chart, lab work & pertinent test results  Airway Mallampati: II TM Distance: >3 FB Neck ROM: Full    Dental no notable dental hx.    Pulmonary shortness of breath, Current Smoker,  He states his breathing is fine today and at baseline. breath sounds clear to auscultation  Pulmonary exam normal       Cardiovascular hypertension, Pt. on medications and Pt. on home beta blockers + CAD, + Past MI and +CHF Rhythm:Regular Rate:Normal  No recent chest pain.   Neuro/Psych PSYCHIATRIC DISORDERS Gait unsteady after CVA TIACVA    GI/Hepatic negative GI ROS, Neg liver ROS, GERD-  ,  Endo/Other  negative endocrine ROS  Renal/GU Renal InsufficiencyRenal diseaseCr 1.6 K 4.4  negative genitourinary   Musculoskeletal negative musculoskeletal ROS (+)   Abdominal   Peds negative pediatric ROS (+)  Hematology negative hematology ROS (+)   Anesthesia Other Findings   Reproductive/Obstetrics negative OB ROS                        Anesthesia Physical Anesthesia Plan  ASA: III  Anesthesia Plan: MAC   Post-op Pain Management:    Induction: Intravenous  Airway Management Planned:   Additional Equipment:   Intra-op Plan:   Post-operative Plan:   Informed Consent: I have reviewed the patients History and Physical, chart, labs and discussed the procedure including the risks, benefits and alternatives for the proposed anesthesia with the patient or authorized representative who has indicated his/her understanding and acceptance.   Dental advisory given  Plan Discussed with: CRNA  Anesthesia Plan Comments:         Anesthesia Quick Evaluation

## 2013-03-18 NOTE — H&P (Signed)
Patient interval history reviewed.  Patient examined again.  There has been no change from documented H/P dated 03/13/13 (scanned into chart from our office) except as documented above.  Assessment:  1.  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 2.  Pancreatic mass with elevated Ca 19-9, suspect pancreatic cancer.  Plan:  1.  Endoscopic ultrasound. 2.  Endoscopy with possible duodenal stent placement. 3.  Risks (bleeding, infection, bowel perforation that could require surgery, sedation-related changes in cardiopulmonary systems), benefits (identification and possible treatment of source of symptoms, exclusion of certain causes of symptoms), and alternatives (watchful waiting, radiographic imaging studies, empiric medical treatment) of upper endoscopy with ultrasound, possible duodenal stent placement were explained to patient in detail and patient wishes to proceed.

## 2013-03-18 NOTE — Op Note (Signed)
[facility] [address] [place], [address]   ENDOSCOPIC ULTRASOUND PROCEDURE REPORT  PATIENT: Shaquile, Lutze  MR#: [id number] BIRTHDATE: 07-Nov-1945  GENDER: Male ENDOSCOPIST: Willis Modena, MD REFERRED BY:  Duwaine Maxin, MD PROCEDURE DATE:  03/18/2013 PROCEDURE:   Upper EUS w/FNA; EGD with duodenal stent placement ASA CLASS:      Class III INDICATIONS:   1.  pancreatic mass, partial duodenal obstruction. MEDICATIONS: General endotracheal anesthesia (GETA)  DESCRIPTION OF PROCEDURE:   After the risks benefits and alternatives of the procedure were  explained, informed consent was obtained. The patient was then placed in the left, lateral, decubitus postion and IV sedation was administered. Throughout the procedure, the patients blood pressure, pulse and oxygen saturations were monitored continuously.  Under direct visualization, the Pentax EUS Linear [id number] and the diagnostic and therapeutic forward-viewing  endoscopes were introduced through the mouth  and advanced to the bulb of duodenum .  Water was used as necessary to provide an acoustic interface.  Upon completion of the imaging, water was removed and the patient was sent to the recovery room in satisfactory condition.     FINDINGS:      EGD showed patent pylorus.  Tight stricture, with neighboring tissue friability and ulceration, at distal duodenal bulb, through which diagnostic scope would not pass.  EUS then done.  Extensive changes of chronic pancreatitis throughout the head, neck, body and tail of pancreas.  Unable to view most of the uncinate pancreas, given the inability to pass the scope into the descending duodenum due to his duodenal stricture.  However, at what I presume is the most cephalad portion of the uncinate pancreas, an irregular 20 x 21 mm hypoechoic lesion was seen. Lesion was biopsied x 3 with 25g needle; preliminary cytology, reviewed in my presence by Dr. Luisa Hart, was  consistent with adenocarcinoma.  Lesion distinct from the bile duct and ampulla, and no evidence of biliary obstructure was seen.  No adenopathy seen.  No obvious vascular involvement of PV, SMV, SMA, celiac artery.  Subsequently, we then exchanged scopes back for the therapeutic endoscope.  Under fluoroscopic guidance, a 0.035 guidewire pass passed through the stricture.  After injection of contrast confirmed we were in the descending duodenum, the stricture appeared to be about 3cm in total length.  Under endoscopic and fluoroscopic guidance, an uncovered metal duodenal stent (22mm x 9cm) was placed across the stricture, with the proximal flare of the stent across the pylorus and in the distal antrum.  IMPRESSION:     1.  Presumed uncinate pancreas mass, preliminary biopsies worrisome for adenocarcinoma. 2.  Partial proximal duodenal obstruction, due to pancreatic mass, stent placed across stricture as above.   RECOMMENDATIONS:     1.  Watch for potential complications of procedure. 2.  Clear liquid diet for 24 hours, then advance to full liquids tomorrow.  Would start soft mechanical diet in a few days, but would not advance diet past soft mechanical diet for the forseeable future. 3.  Await final cytology results. 4.  Will discuss case with Dr. Anda Kraft.   _______________________________ Rosalie Doctor:  Willis Modena, MD 03/18/2013 1:09 PM CC:

## 2013-03-18 NOTE — Addendum Note (Signed)
Addended by: Willis Modena on: 03/18/2013 08:26 AM   Modules accepted: Orders

## 2013-03-18 NOTE — Transfer of Care (Signed)
Immediate Anesthesia Transfer of Care Note  Patient: Martin Haney  Procedure(s) Performed: Procedure(s): ESOPHAGEAL ENDOSCOPIC ULTRASOUND (EUS) RADIAL (N/A) FINE NEEDLE ASPIRATION (FNA) LINEAR (N/A) DUODENAL STENT PLACEMENT (N/A)  Patient Location: PACU  Anesthesia Type:General  Level of Consciousness: awake, alert  and oriented  Airway & Oxygen Therapy: Patient Spontanous Breathing and Patient connected to face mask oxygen  Post-op Assessment: Report given to PACU RN and Post -op Vital signs reviewed and stable  Post vital signs: Reviewed and stable  Complications: No apparent anesthesia complications

## 2013-03-19 ENCOUNTER — Encounter (HOSPITAL_COMMUNITY): Payer: Self-pay | Admitting: Gastroenterology

## 2013-05-02 ENCOUNTER — Other Ambulatory Visit: Payer: Self-pay

## 2013-05-06 ENCOUNTER — Encounter: Payer: Self-pay | Admitting: Internal Medicine

## 2013-05-06 ENCOUNTER — Ambulatory Visit (INDEPENDENT_AMBULATORY_CARE_PROVIDER_SITE_OTHER): Payer: Medicare Other | Admitting: Internal Medicine

## 2013-05-06 VITALS — BP 142/78 | HR 89 | Temp 97.6°F | Resp 14 | Ht 73.0 in | Wt 160.0 lb

## 2013-05-06 DIAGNOSIS — C259 Malignant neoplasm of pancreas, unspecified: Secondary | ICD-10-CM

## 2013-05-06 DIAGNOSIS — R6881 Early satiety: Secondary | ICD-10-CM

## 2013-05-06 DIAGNOSIS — M549 Dorsalgia, unspecified: Secondary | ICD-10-CM

## 2013-05-06 DIAGNOSIS — R509 Fever, unspecified: Secondary | ICD-10-CM

## 2013-05-06 MED ORDER — ONDANSETRON 4 MG PO TBDP: 4.0000 mg | ORAL_TABLET | Freq: Three times a day (TID) | ORAL | Status: AC | PRN

## 2013-05-06 MED ORDER — ONDANSETRON 4 MG PO TBDP: 4.0000 mg | ORAL_TABLET | Freq: Three times a day (TID) | ORAL | Status: DC | PRN

## 2013-05-06 NOTE — Progress Notes (Addendum)
Patient ID: Martin Haney, male   DOB: [birth year], 67 y.o.   MRN: [id number]  Patient Active Problem List   Diagnosis Date Noted  . Pancreatic cancer 05/07/2013  . Back pain, acute 05/07/2013  . Fever 05/07/2013  . Hypercalcemia 08/01/2012  . Erectile dysfunction 08/01/2012  . CVA (cerebral vascular accident) 08/05/2011  . Tobacco abuse   . Tobacco abuse counseling   . CHF (congestive heart failure)   . Lung Cancer   . Chronic kidney disease   . TIA (transient ischemic attack) 05/23/2011  . Cerebrovascular disease 05/23/2011  . Hypertension 05/23/2011  . Arteriosclerotic coronary artery disease 05/23/2011    Subjective:  CC:   Chief Complaint  Patient presents with  . Follow-up    from hospital right sided weakness patient reports falling twice.    HPI:   Martin Haney a 67 y.o. male who presents for hospital follow up.  He was admitted to [facility] on 9/20 with severe  Back pain, fever and elevated troponin . Back pain had been intermittent since his last surgery in 2004, but had started again 2  Days prior to admission.  Had fallen twice once in shower, once before he entered the shower so he was taken to ER by EMS for eval and treatment.  Blood and urine cultures were negative .  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 And a stale 3.6 cm right adrenal mass compared to 2010 imaging. He was admitted to telemetry, underwent cardiology evaluation for elevated troponins.  Eval included ECHO noting 55 to 60% EF,  LVH, diastolic dysfunction. He was treated with IV solumedrol , Levaquin for FUO and PT and discharged  on 9/21 on a six day prednisone taper.  Since discharge his pain has improved and his appetite is good.  He is seeing Dr [name] tomorrow for ongoing treatment of pancreatic cancer. Using vicodin 10/325  Still has nausea occasionally .    Past Medical History   Diagnosis Date  . Hypertension   . Hyperlipidemia   . Thyroid disease   . Tobacco abuse   . Tobacco abuse counseling   . CHF (congestive heart failure)     ischemic cardiomyopathy, systolic and diastolic dysfunction  by  ECHO Mar 2011  . Cerebrovascular disease Sept 2012    diffuse, by CT angiogram   . Myocardial infarction yrs ago     1 or 2 mi's  . Stroke 2012 or 2013  . 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 Lung Cancer yrs ago    s/p LUL lobecotmy, remote  . Shortness of breath   . GERD (gastroesophageal reflux disease)     Past Surgical History  Procedure Laterality Date  . Lobectomy  yrs ago    LUL,   . Back surgery  2003    2 operations, herniated disc  . Ankle fracture surgery Left yrs ago  . Eus N/A 03/18/2013    Procedure: ESOPHAGEAL ENDOSCOPIC ULTRASOUND (EUS) RADIAL;  Surgeon: Willis Modena, MD;  Location: [facility];  Service: Endoscopy;  Laterality: N/A;  . Fine needle aspiration N/A 03/18/2013    Procedure: FINE NEEDLE ASPIRATION (FNA) LINEAR;  Surgeon: Willis Modena, MD;  Location: [facility];  Service: Endoscopy;  Laterality: N/A;  . Duodenal stent placement N/A 03/18/2013    Procedure: DUODENAL STENT PLACEMENT;  Surgeon: Willis Modena, MD;  Location: [facility];  Service: Endoscopy;  Laterality: N/A;    The following portions of the patient's history were reviewed and updated as appropriate: Allergies, current medications, and problem list.    Review of Systems:   .Patient denies headache, fevers, malaise, unintentional weight loss, skin rash, eye pain, sinus congestion and sinus pain, sore throat, dysphagia,  hemoptysis , cough, dyspnea, wheezing, chest pain, palpitations, orthopnea, edema, abdominal pain, nausea, melena, diarrhea, constipation, flank pain, dysuria, hematuria, urinary  Frequency, nocturia, numbness, tingling, seizures,  Focal weakness, Loss of consciousness,  Tremor, insomnia, depression, anxiety,  and suicidal ideation.        History   Social History  . Marital Status: Single    Spouse Name: N/A    Number of Children: N/A  . 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 Smoking status: Current Every Day Smoker -- 1.00 packs/day for 50 years    Types: Cigarettes  . Smokeless tobacco: Never Used     Comment: patient has resumed smoking 2-3 daily   . Alcohol Use: No  . Drug Use: No  . Sexual Activity: Yes    Partners: Female   Other Topics Concern  . 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 ROM normal. No CVA tenderness. Lungs: clear to auscultation bilaterally Heart: regular rate and rhythm, S1, S2 normal, no murmur, click, rub or gallop Abdomen: soft, non-tender; bowel sounds normal; no masses,  no organomegaly Pulses: 2+ and symmetric Skin: Skin color, texture, turgor normal. No rashes or lesions Lymph nodes: Cervical, supraclavicular, and axillary nodes normal.  Assessment and Plan:  Pancreatic cancer 3 cm pancreatic head mass found during workup in July for early satiety , weight loss and vomiting. He is following up  Oncology Dr [name] Tomorrow. .  Back pain, acute CT negative was for mets, but noted degenerative changes at multiple levels. His pain was Aggravated by falls, and improved with solumedrol/prednisone. Continue taper and prn vicodin will consider palliative care  consult if pain worsens  Fever Workup was negative.  He appears well today  .  I have advised that he dc the levaquin due to increaed risk of dificile colitis  Updated Medication List Outpatient Encounter Prescriptions as of 05/06/2013  Medication Sig Dispense Refill  . budesonide-formoterol (SYMBICORT) 160-4.5 MCG/ACT inhaler Inhale 2 puffs into the lungs 2 (two) times daily as needed (for shortness of breath).      . carvedilol (COREG) 25 MG tablet Take 25 mg by mouth 2 (two) times daily with a meal.      . erlotinib (TARCEVA) 100 MG tablet Take 100 mg by mouth daily. Take on an empty stomach 1 hour before meals or 2 hours after      . hydrALAZINE (APRESOLINE) 50 MG tablet Take 100 mg by mouth 4 (four) times daily.       Marland Kitchen HYDROcodone-acetaminophen (NORCO) 10-325 MG per tablet Take 1  tablet by mouth every 6 (six) hours as needed.      Marland Kitchen lisinopril (PRINIVIL,ZESTRIL) 40 MG tablet Take 40 mg by mouth daily.      . megestrol (MEGACE) 40 MG/ML suspension Take 10 mLs by mouth daily.      . nitroGLYCERIN (NITROSTAT) 0.4 MG SL tablet Place 0.4 mg under the tongue every 5 (five) minutes as needed for chest pain.       Marland Kitchen ondansetron (ZOFRAN-ODT) 4 MG disintegrating tablet Take 1 tablet (4 mg total) by mouth 3 (three) times daily as needed.  30 tablet  1  . pantoprazole (PROTONIX) 40 MG tablet Take 40 mg by mouth 2 (two) times daily.      . predniSONE (DELTASONE) 5 MG tablet Take 1 tablet by mouth daily.      Marland Kitchen spironolactone (ALDACTONE) 25 MG tablet Take 1 tablet by mouth daily.      . [DISCONTINUED] ondansetron (ZOFRAN-ODT) 4 MG disintegrating tablet Take 1 tablet by mouth 3 (three) times daily as needed.      . [DISCONTINUED] ondansetron (ZOFRAN-ODT) 4 MG disintegrating tablet Take 1 tablet (4 mg total) by mouth 3 (three) times daily as needed.  30 tablet  1  . aspirin 325 MG EC tablet Take 325 mg by mouth daily.        . cholecalciferol (VITAMIN D) 1000 UNITS tablet Take 1,000 Units by mouth daily.       . clomiPRAMINE (ANAFRANIL) 75 MG capsule Take 75 mg by mouth at bedtime.       No facility-administered encounter medications on file as of 05/06/2013.     No orders of the defined types were placed in this encounter.    No Follow-up on file.

## 2013-05-06 NOTE — Patient Instructions (Addendum)
Stop the levofloxacin unless you have a fever ( T emp 100.4 or higher )   continue the prednisone taper  Use the odansetron only if needed for nausea   YOU did not have a heart attack

## 2013-05-07 ENCOUNTER — Encounter: Payer: Self-pay | Admitting: Internal Medicine

## 2013-05-07 DIAGNOSIS — M549 Dorsalgia, unspecified: Secondary | ICD-10-CM | POA: Insufficient documentation

## 2013-05-07 DIAGNOSIS — R509 Fever, unspecified: Secondary | ICD-10-CM | POA: Insufficient documentation

## 2013-05-07 DIAGNOSIS — C259 Malignant neoplasm of pancreas, unspecified: Secondary | ICD-10-CM | POA: Insufficient documentation

## 2013-05-07 NOTE — Assessment & Plan Note (Addendum)
CT negative for mets, noted degenerative changes at multiple levels. Aggravated by falls,  improved with solumedrol/prednisone. Continue taper and prn vicodin will consider palliative care consult if pain worsens

## 2013-05-07 NOTE — Assessment & Plan Note (Signed)
3 cm pancreatic head mass found during workup for early satiety , weight loss and vomiting.  Follow up with [name]

## 2013-05-07 NOTE — Assessment & Plan Note (Signed)
Workup was negative  levaquin discontined

## 2013-05-09 ENCOUNTER — Telehealth: Payer: Self-pay | Admitting: Internal Medicine

## 2013-05-09 MED ORDER — LISINOPRIL 40 MG PO TABS: 40.0000 mg | ORAL_TABLET | Freq: Every day | ORAL | Status: AC

## 2013-05-09 MED ORDER — HYDRALAZINE HCL 50 MG PO TABS: 100.0000 mg | ORAL_TABLET | Freq: Four times a day (QID) | ORAL | Status: DC

## 2013-05-09 NOTE — Telephone Encounter (Signed)
Scripts sent

## 2013-05-09 NOTE — Telephone Encounter (Addendum)
hydrALAZINE (APRESOLINE) 50 MG tablet   lisinopril (PRINIVIL,ZESTRIL) 40 MG tablet

## 2013-05-16 DIAGNOSIS — M5126 Other intervertebral disc displacement, lumbar region: Secondary | ICD-10-CM

## 2013-05-16 DIAGNOSIS — C259 Malignant neoplasm of pancreas, unspecified: Secondary | ICD-10-CM

## 2013-05-16 DIAGNOSIS — IMO0001 Reserved for inherently not codable concepts without codable children: Secondary | ICD-10-CM

## 2013-05-16 DIAGNOSIS — E119 Type 2 diabetes mellitus without complications: Secondary | ICD-10-CM

## 2013-05-27 ENCOUNTER — Telehealth: Payer: Self-pay | Admitting: Internal Medicine

## 2013-05-27 NOTE — Telephone Encounter (Signed)
Hydralazine needed.  Also asking if something had been called in for sleep.

## 2013-05-29 ENCOUNTER — Telehealth: Payer: Self-pay | Admitting: *Deleted

## 2013-05-29 MED ORDER — HYDRALAZINE HCL 50 MG PO TABS: 100.0000 mg | ORAL_TABLET | Freq: Four times a day (QID) | ORAL | Status: AC

## 2013-05-29 NOTE — Telephone Encounter (Signed)
Are the directions correct for his hydralazine? Attempted to call pt, but no answer and no voicemail to clarify how he has been taking it.

## 2013-05-29 NOTE — Telephone Encounter (Signed)
Spoke with Stanton Kidney, his emergency contact, the oncologist sent in a Rx for sleep so that is not needed from Dr. Darrick Huntsman. States the he takes the hydralazine 2 tabs 4 times daily. Rx sent to pharmacy.

## 2013-05-29 NOTE — Telephone Encounter (Signed)
The patient is completely out of his medication

## 2013-05-30 NOTE — Telephone Encounter (Signed)
error 

## 2013-06-02 DIAGNOSIS — M549 Dorsalgia, unspecified: Secondary | ICD-10-CM

## 2013-06-02 DIAGNOSIS — R509 Fever, unspecified: Secondary | ICD-10-CM

## 2013-06-02 DIAGNOSIS — C259 Malignant neoplasm of pancreas, unspecified: Secondary | ICD-10-CM

## 2013-06-02 DIAGNOSIS — R6881 Early satiety: Secondary | ICD-10-CM

## 2013-06-09 ENCOUNTER — Ambulatory Visit: Payer: Medicare Other | Admitting: Internal Medicine

## 2013-06-23 ENCOUNTER — Ambulatory Visit: Payer: Medicare Other | Admitting: Internal Medicine

## 2013-07-03 ENCOUNTER — Ambulatory Visit: Payer: Medicare Other | Admitting: Internal Medicine

## 2013-08-08 ENCOUNTER — Ambulatory Visit: Payer: Medicare Other | Admitting: Internal Medicine

## 2013-08-13 ENCOUNTER — Ambulatory Visit: Payer: Medicare Other | Admitting: Internal Medicine

## 2013-08-19 ENCOUNTER — Ambulatory Visit: Payer: Medicare Other | Admitting: Internal Medicine

## 2013-08-19 ENCOUNTER — Encounter: Payer: Self-pay | Admitting: Internal Medicine

## 2013-08-20 ENCOUNTER — Telehealth: Payer: Self-pay | Admitting: Internal Medicine

## 2013-08-20 MED ORDER — SPIRONOLACTONE 25 MG PO TABS: 25.0000 mg | ORAL_TABLET | Freq: Every day | ORAL | Status: AC

## 2013-08-20 MED ORDER — CARVEDILOL 25 MG PO TABS: 25.0000 mg | ORAL_TABLET | Freq: Two times a day (BID) | ORAL | Status: AC

## 2013-08-20 NOTE — Telephone Encounter (Signed)
Scheduled patient for follow up was warned verbally by Dr. Derrel Nip no more missed appointments.

## 2013-09-04 ENCOUNTER — Ambulatory Visit: Payer: Medicare Other | Admitting: Internal Medicine

## 2013-09-25 ENCOUNTER — Other Ambulatory Visit: Payer: Self-pay | Admitting: Internal Medicine

## 2013-10-03 ENCOUNTER — Telehealth: Payer: Self-pay | Admitting: Internal Medicine

## 2013-10-03 NOTE — Telephone Encounter (Signed)
Death certificate has been returned for more info. I filled out all the clerical info I assumed you were going to do,  But i still need the time of death.  Patient was in Hospice so I do not know the time of death,  Can you please call hospice and get date and time of eath?

## 2013-10-03 NOTE — Telephone Encounter (Signed)
I apologize, I missed that, copies were made.Hospice has date of 10-08-13 but does not have time , Called [facility] to get time of death because patient palative  Care, Hospital does not have time of death yet due to being to soon that takes 24 to 48 hours for discharge summary to reach records.

## 2013-10-12 DEATH — deceased

## 2013-12-15 IMAGING — CT CT HEAD WITHOUT CONTRAST
2 series · 15 of 30 positions shown, 19 images · non-contrast
Comparison: none

REASON FOR EXAM: leg weakness..hx of cva in past..p;t is in [place]
COMMENTS:

PROCEDURE:     CT  - CT HEAD WITHOUT CONTRAST  - August 02, 2011  [DATE]
RESULT:     Comparison:  05/15/2011
TECHNIQUE: Multiple axial images from the foramen magnum to the vertex were
obtained without IV contrast.

[Series 2: without · axial · non-contrast · 0.39mm/px · z∈[+544,+669]mm · 13 of 31 slices shown, 17 images]
[im 3/31  brain]
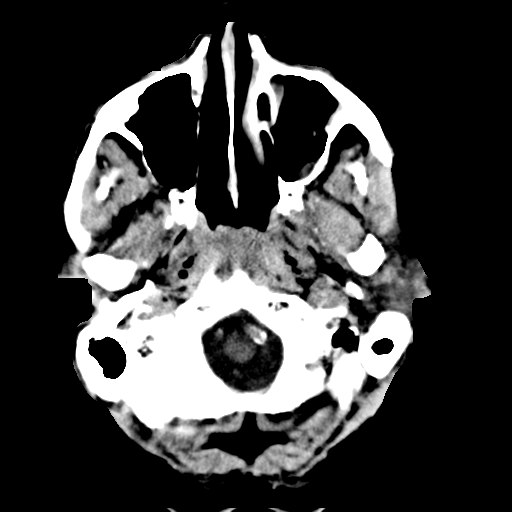
[im 3/31  bone]
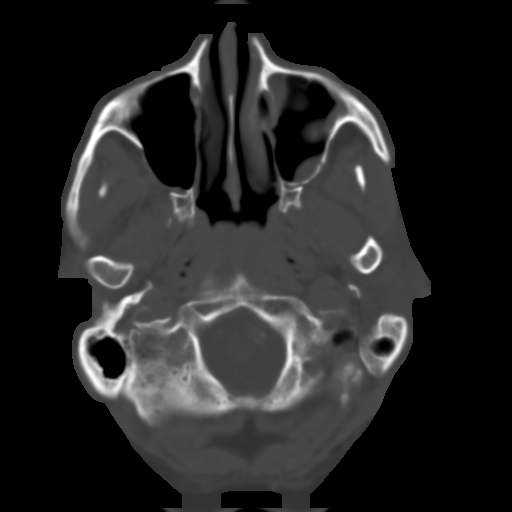
[im 5/31  brain]
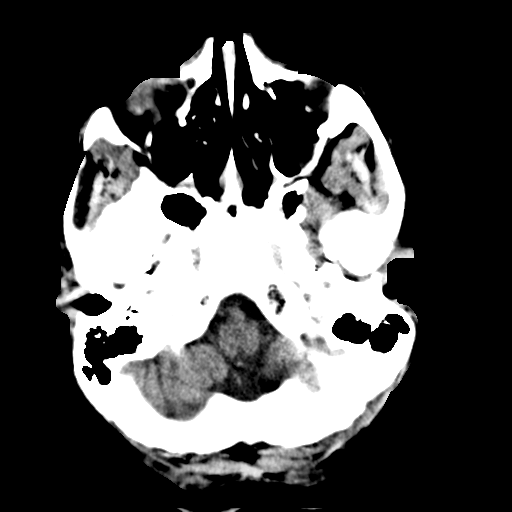
[im 7/31  brain]
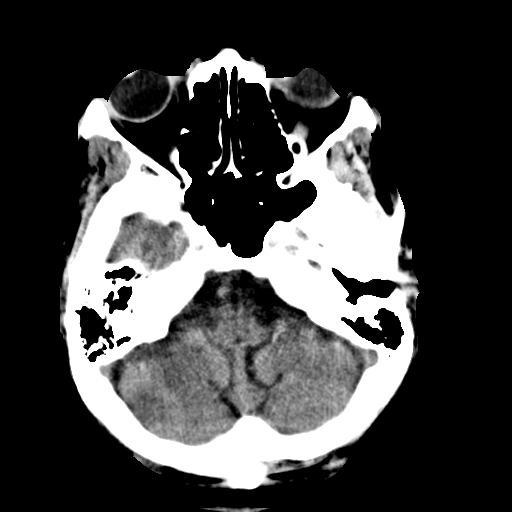
[im 9/31  brain]
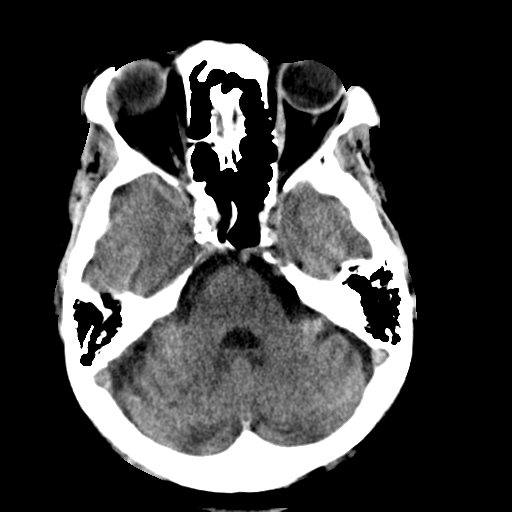
[im 11/31  brain]
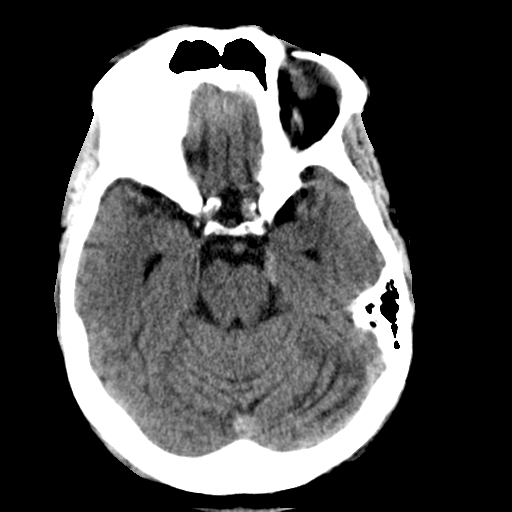
[im 11/31  bone]
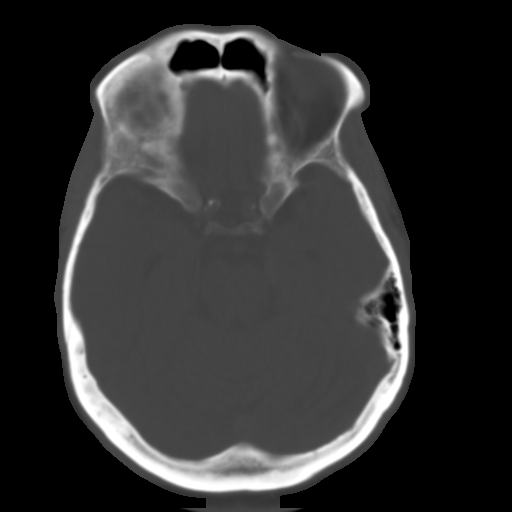
[im 13/31  brain]
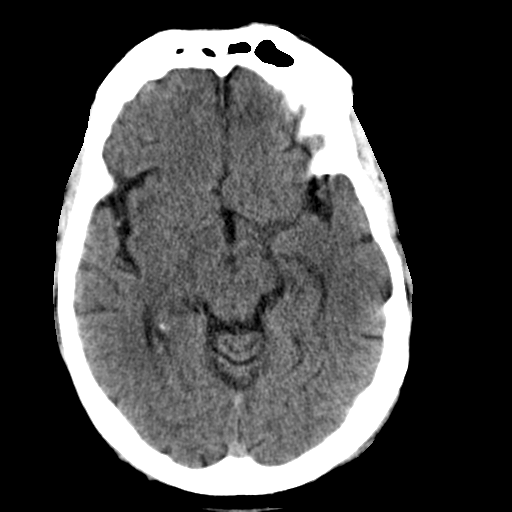
[im 16/31  brain]
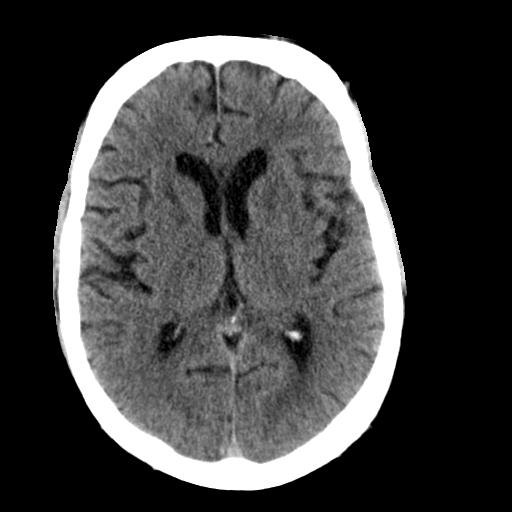
[im 18/31  brain]
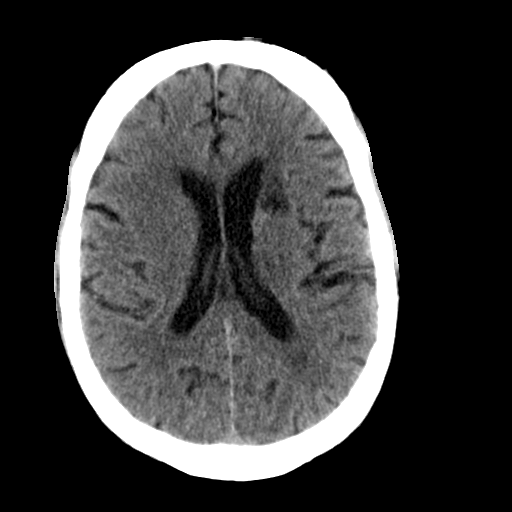
[im 20/31  brain]
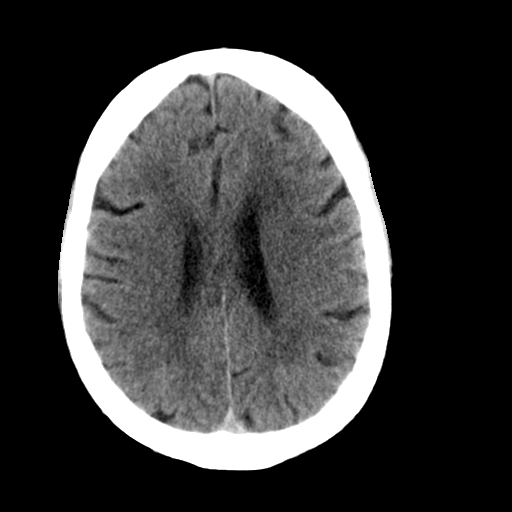
[im 20/31  bone]
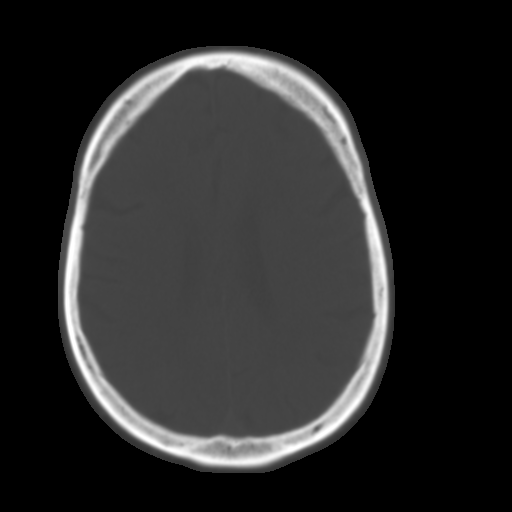
[im 22/31  brain]
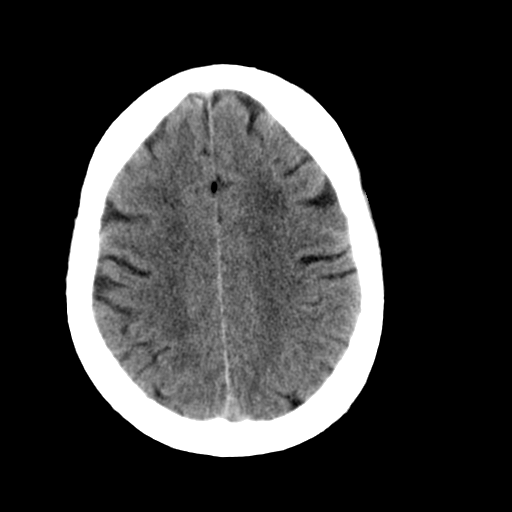
[im 24/31  brain]
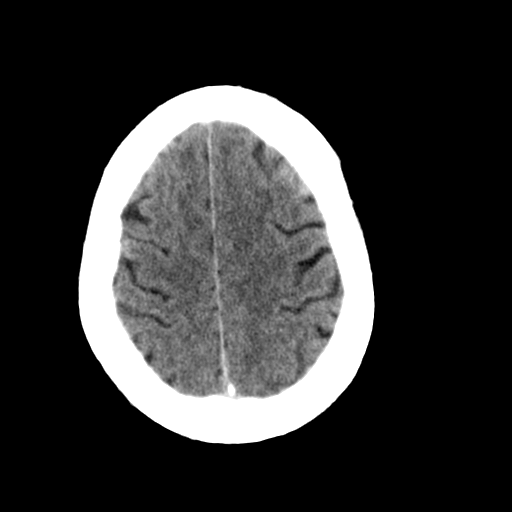
[im 26/31  brain]
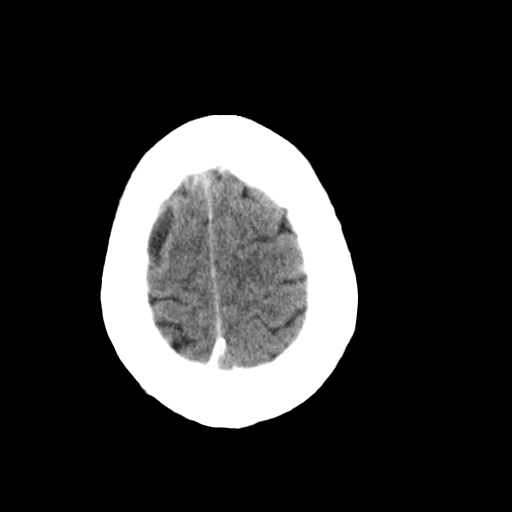
[im 28/31  brain]
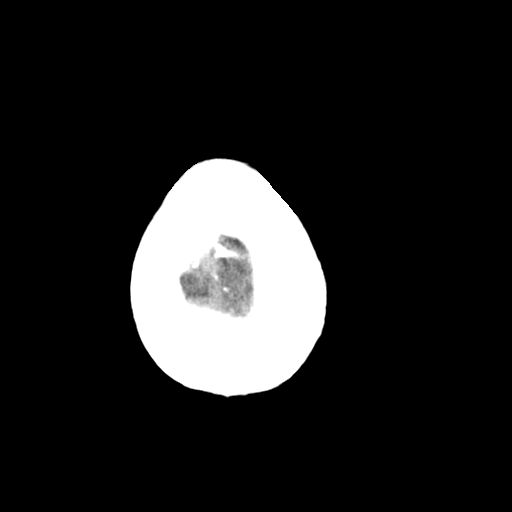
[im 28/31  bone]
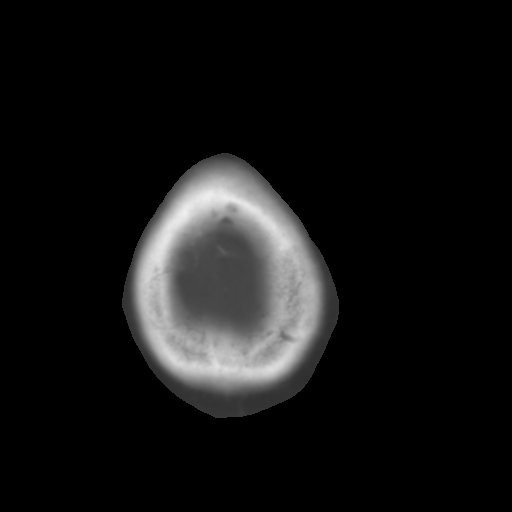

[Series 3: bone · axial · 0.39mm/px · z∈[+544,+564]mm · 2 of 31 slices shown]
[im 3/31  bone]
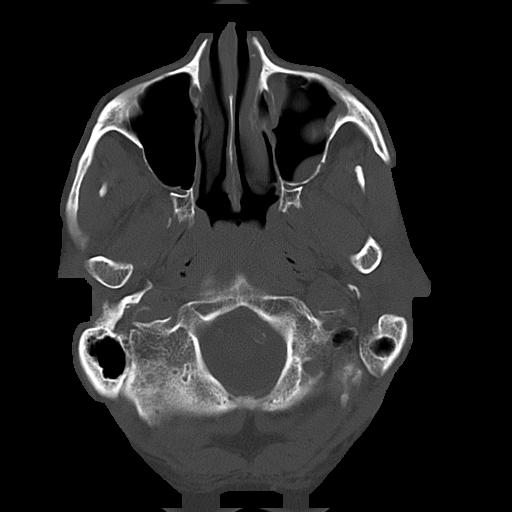
[im 7/31  bone]
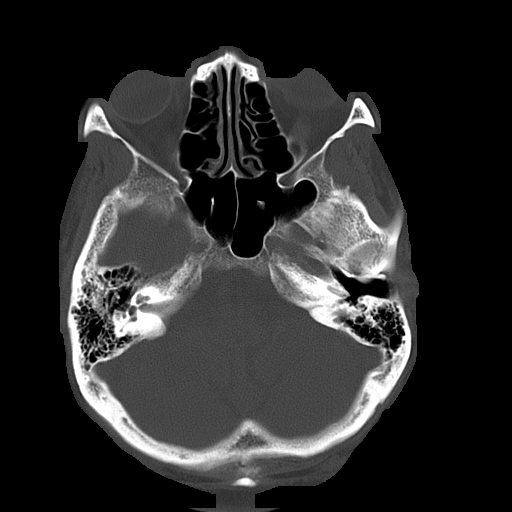

[15 of 30 positions shown; findings below may reference images not displayed]

FINDINGS: There is no evidence for mass effect, midline shift, or extra-axial fluid
collections. There is no evidence for space-occupying lesion, intracranial
hemorrhage, or acute cortical-based area of infarction. Periventricular and
subcortical hypoattenuation is consistent with chronic small vessel ischemic
disease. Encephalomalacia is seen from old prior infarct in the left corona
radiata, similar to prior.

There is minimal soft tissue thickening along the left frontal scalp,
similar to prior. There is polypoid mucosal thickening in the left maxillary
sinus.

The osseous structures are unremarkable.
IMPRESSION: 1. No acute intracranial process.
2. Chronic small vessel ischemic disease.

CT can underestimate ischemia in the first 24 hours after the event. If
there is clinical concern for an acute infarct, a followup MRI or repeat CT
scan in 24 hours may provide additional information.
